# Patient Record
Sex: Female | Born: 1990 | Race: Black or African American | Hispanic: No | Marital: Single | Smoking: Never smoker
Health system: Southern US, Community
[De-identification: ages and names within clinical notes are randomized; demographics above are authoritative.]

## PROBLEM LIST (undated history)

## (undated) DIAGNOSIS — D649 Anemia, unspecified: Secondary | ICD-10-CM

## (undated) DIAGNOSIS — A6000 Herpesviral infection of urogenital system, unspecified: Secondary | ICD-10-CM

## (undated) DIAGNOSIS — T4145XA Adverse effect of unspecified anesthetic, initial encounter: Secondary | ICD-10-CM

## (undated) DIAGNOSIS — A749 Chlamydial infection, unspecified: Secondary | ICD-10-CM

## (undated) DIAGNOSIS — J40 Bronchitis, not specified as acute or chronic: Secondary | ICD-10-CM

## (undated) DIAGNOSIS — R51 Headache: Secondary | ICD-10-CM

## (undated) DIAGNOSIS — T8859XA Other complications of anesthesia, initial encounter: Secondary | ICD-10-CM

## (undated) DIAGNOSIS — R519 Headache, unspecified: Secondary | ICD-10-CM

## (undated) DIAGNOSIS — B999 Unspecified infectious disease: Secondary | ICD-10-CM

## (undated) DIAGNOSIS — N83209 Unspecified ovarian cyst, unspecified side: Secondary | ICD-10-CM

---
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
## 2011-01-20 HISTORY — PX: HERNIA REPAIR: SHX51

## 2011-02-13 ENCOUNTER — Emergency Department (HOSPITAL_COMMUNITY)
Admission: EM | Admit: 2011-02-13 | Discharge: 2011-02-13 | Disposition: A | Discharge status: Home / Self Care | Payer: Self-pay | Attending: Emergency Medicine | Admitting: Emergency Medicine

## 2011-02-13 ENCOUNTER — Encounter (HOSPITAL_COMMUNITY): Payer: Self-pay

## 2011-02-13 DIAGNOSIS — M545 Low back pain, unspecified: Secondary | ICD-10-CM | POA: Insufficient documentation

## 2011-02-13 DIAGNOSIS — N72 Inflammatory disease of cervix uteri: Secondary | ICD-10-CM | POA: Insufficient documentation

## 2011-02-13 DIAGNOSIS — N898 Other specified noninflammatory disorders of vagina: Secondary | ICD-10-CM | POA: Insufficient documentation

## 2011-02-13 DIAGNOSIS — R300 Dysuria: Secondary | ICD-10-CM | POA: Insufficient documentation

## 2011-02-13 HISTORY — DX: Anemia, unspecified: D64.9

## 2011-02-13 LAB — URINE MICROSCOPIC-ADD ON

## 2011-02-13 LAB — URINALYSIS, ROUTINE W REFLEX MICROSCOPIC
Glucose, UA: NEGATIVE mg/dL
Hgb urine dipstick: NEGATIVE
Protein, ur: 30 mg/dL — AB
pH: 7.5 (ref 5.0–8.0)

## 2011-02-13 LAB — WET PREP, GENITAL: Yeast Wet Prep HPF POC: NONE SEEN

## 2011-02-13 LAB — POCT PREGNANCY, URINE: Preg Test, Ur: NEGATIVE

## 2011-02-13 MED ORDER — CEFTRIAXONE SODIUM 250 MG IJ SOLR
250.0000 mg | Freq: Once | INTRAMUSCULAR | Status: AC
  Administered 2011-02-13: 250 mg via INTRAMUSCULAR
  Filled 2011-02-13: qty 250

## 2011-02-13 MED ORDER — AZITHROMYCIN 1 G PO PACK
1.0000 g | PACK | Freq: Once | ORAL | Status: AC
  Administered 2011-02-13: 1 g via ORAL
  Filled 2011-02-13: qty 1

## 2011-02-13 MED ORDER — PHENAZOPYRIDINE HCL 200 MG PO TABS: 200.0000 mg | ORAL_TABLET | Freq: Three times a day (TID) | ORAL | Status: AC

## 2011-02-13 NOTE — ED Notes (Addendum)
Pt presents with vaginal discharge x 2 days with abdominal pain and back pain, burning with urination. Was by a doctor yesterday and was given a prescription for Cipro but patient claimed that her symptoms are worse

## 2011-02-13 NOTE — ED Provider Notes (Signed)
History     CSN: [id number]  Arrival date & time 02/13/11  1408   None     Chief Complaint  Patient presents with  . Vaginal Discharge    (Consider location/radiation/quality/duration/timing/severity/associated sxs/prior treatment) HPI Complains of dysuria with vaginal discharge onset 4 days ago seen at [facility] at [facility] yesterday received fluconazole, prescription for Cipro of which she's taken 3 doses, and metronidazole, without relief. Symptoms worse with urination. No other complaint. Pain is at urethral meatus radiates low back. No nausea no vomiting no other complaint. Pain is worse with urination she is asymptomatic when not urinating aside from vaginal discharge Past Medical History  Diagnosis Date  . Anemia     Past Surgical History  Procedure Date  . Hernia repair     No family history on file.  History  Substance Use Topics  . Smoking status: Not on file  . Smokeless tobacco: Not on file  . Alcohol Use: Yes   Nonsmoker OB History    Grav Para Term Preterm Abortions TAB SAB Ect Mult Living                  Review of Systems  Constitutional: Negative.   HENT: Negative.   Respiratory: Negative.   Cardiovascular: Negative.   Gastrointestinal: Negative.   Genitourinary: Positive for dysuria and vaginal discharge.  Musculoskeletal: Negative.   Skin: Negative.   Neurological: Negative.   Hematological: Negative.   Psychiatric/Behavioral: Negative.     Allergies  Review of patient's allergies indicates no known allergies.  Home Medications   Current Outpatient Rx  Name Route Sig Dispense Refill  . CIPROFLOXACIN HCL 500 MG PO TABS Oral Take 500 mg by mouth 2 (two) times daily. Started on 02/12/11 for a uti    . MEDROXYPROGESTERONE ACETATE 150 MG/ML IM SUSP Intramuscular Inject 150 mg into the muscle every 3 (three) months.    Marland Kitchen METRONIDAZOLE 500 MG PO TABS Oral Take 500 mg by mouth 2 (two) times daily. Started on 02-12-11  for uti.      BP 124/81  Pulse 103  Temp(Src) 98.2 F (36.8 C) (Oral)  Resp 16  SpO2 99%  LMP 01/15/2011  Physical Exam  Constitutional: She appears well-developed and well-nourished.  HENT:  Head: Normocephalic and atraumatic.  Eyes: Conjunctivae are normal. Pupils are equal, round, and reactive to light.  Neck: Neck supple. No tracheal deviation present. No thyromegaly present.  Cardiovascular: Normal rate and regular rhythm.   No murmur heard. Pulmonary/Chest: Effort normal and breath sounds normal.  Abdominal: Soft. Bowel sounds are normal. She exhibits no distension. There is no tenderness.  Genitourinary: Vaginal discharge found.       No external lesion; slight clear vaginal discharge; positive cervical motion tenderness, cervix erythematous, cervical os closed no adnexal masses or tenderness  Musculoskeletal: Normal range of motion. She exhibits no edema and no tenderness.  Neurological: She is alert. Coordination normal.  Skin: Skin is warm and dry. No rash noted.  Psychiatric: She has a normal mood and affect.    ED Course  Procedures (including critical care time)   Labs Reviewed  URINALYSIS, ROUTINE W REFLEX MICROSCOPIC  POCT PREGNANCY, URINE   No results found.   No diagnosis found.  Results for orders placed during the hospital encounter of 02/13/11  URINALYSIS, ROUTINE W REFLEX MICROSCOPIC      Component Value Range   Color, Urine YELLOW  YELLOW    APPearance CLEAR  CLEAR  Specific Gravity, Urine 1.027  1.005 - 1.030    pH 7.5  5.0 - 8.0    Glucose, UA NEGATIVE  NEGATIVE (mg/dL)   Hgb urine dipstick NEGATIVE  NEGATIVE    Bilirubin Urine NEGATIVE  NEGATIVE    Ketones, ur NEGATIVE  NEGATIVE (mg/dL)   Protein, ur 30 (*) NEGATIVE (mg/dL)   Urobilinogen, UA 1.0  0.0 - 1.0 (mg/dL)   Nitrite NEGATIVE  NEGATIVE    Leukocytes, UA SMALL (*) NEGATIVE   WET PREP, GENITAL      Component Value Range   Yeast, Wet Prep NONE SEEN  NONE SEEN    Trich, Wet  Prep NONE SEEN  NONE SEEN    Clue Cells, Wet Prep FEW (*) NONE SEEN    WBC, Wet Prep HPF POC MANY (*) NONE SEEN   POCT PREGNANCY, URINE      Component Value Range   Preg Test, Ur NEGATIVE  NEGATIVE   URINE MICROSCOPIC-ADD ON      Component Value Range   Squamous Epithelial / LPF FEW (*) RARE    WBC, UA 0-2  <3 (WBC/hpf)   RBC / HPF 0-2  <3 (RBC/hpf)   Bacteria, UA RARE  RARE    Urine-Other MUCOUS PRESENT     No results found.   MDM  In light of fact the patient practices unprotected sex with exam consistent with cervicitis; will treat with Rocephin and Zithromax  rx for pyriduim; F/u student health cencter @  [employer] as needed safe sex encouraged Dx #1 cervicitis #2 dysuria       Doug Sou, MD 02/13/11 1610

## 2011-02-14 LAB — GC/CHLAMYDIA PROBE AMP, GENITAL: GC Probe Amp, Genital: NEGATIVE

## 2011-05-12 ENCOUNTER — Encounter (HOSPITAL_COMMUNITY): Payer: Self-pay

## 2011-05-12 ENCOUNTER — Emergency Department (HOSPITAL_COMMUNITY)
Admission: EM | Admit: 2011-05-12 | Discharge: 2011-05-12 | Disposition: A | Discharge status: Home / Self Care | Payer: Self-pay | Attending: Emergency Medicine | Admitting: Emergency Medicine

## 2011-05-12 DIAGNOSIS — R109 Unspecified abdominal pain: Secondary | ICD-10-CM | POA: Insufficient documentation

## 2011-05-12 DIAGNOSIS — A499 Bacterial infection, unspecified: Secondary | ICD-10-CM | POA: Insufficient documentation

## 2011-05-12 DIAGNOSIS — R10819 Abdominal tenderness, unspecified site: Secondary | ICD-10-CM | POA: Insufficient documentation

## 2011-05-12 DIAGNOSIS — N72 Inflammatory disease of cervix uteri: Secondary | ICD-10-CM | POA: Insufficient documentation

## 2011-05-12 DIAGNOSIS — N760 Acute vaginitis: Secondary | ICD-10-CM | POA: Insufficient documentation

## 2011-05-12 DIAGNOSIS — R110 Nausea: Secondary | ICD-10-CM | POA: Insufficient documentation

## 2011-05-12 DIAGNOSIS — B9689 Other specified bacterial agents as the cause of diseases classified elsewhere: Secondary | ICD-10-CM | POA: Insufficient documentation

## 2011-05-12 DIAGNOSIS — N949 Unspecified condition associated with female genital organs and menstrual cycle: Secondary | ICD-10-CM | POA: Insufficient documentation

## 2011-05-12 LAB — URINE MICROSCOPIC-ADD ON

## 2011-05-12 LAB — URINALYSIS, ROUTINE W REFLEX MICROSCOPIC
Nitrite: NEGATIVE
Protein, ur: NEGATIVE mg/dL
Specific Gravity, Urine: 1.018 (ref 1.005–1.030)
Urobilinogen, UA: 1 mg/dL (ref 0.0–1.0)

## 2011-05-12 LAB — POCT PREGNANCY, URINE: Preg Test, Ur: NEGATIVE

## 2011-05-12 MED ORDER — DOXYCYCLINE HYCLATE 100 MG PO CAPS: 100.0000 mg | ORAL_CAPSULE | Freq: Two times a day (BID) | ORAL | Status: AC

## 2011-05-12 MED ORDER — METRONIDAZOLE 500 MG PO TABS: 500.0000 mg | ORAL_TABLET | Freq: Two times a day (BID) | ORAL | Status: AC

## 2011-05-12 MED ORDER — LIDOCAINE HCL 1 % IJ SOLN
INTRAMUSCULAR | Status: AC
  Filled 2011-05-12: qty 20

## 2011-05-12 MED ORDER — CEFTRIAXONE SODIUM 250 MG IJ SOLR
250.0000 mg | Freq: Once | INTRAMUSCULAR | Status: AC
  Administered 2011-05-12: 250 mg via INTRAMUSCULAR
  Filled 2011-05-12: qty 250

## 2011-05-12 NOTE — ED Provider Notes (Signed)
History     CSN: [id number]  Arrival date & time 05/12/11  1417   First MD Initiated Contact with Patient 05/12/11 1545      Chief Complaint  Patient presents with  . Abdominal Pain    (Consider location/radiation/quality/duration/timing/severity/associated sxs/prior treatment) HPI Comments: Patient reports that she has had intermittent lower abdominal cramping for the past 2.5 months.  Patient also reports that she has had yellowish colored vaginal discharge for the past 1.5 weeks.  She reports that she was seen [facility] and was told that nothing was wrong. PMH significant for Chlamydia and Genital Herpes.  She reports that she currently has unprotected sex and is not on any form of birth control.  LMP 01-09-11.  She states that her periods are irregular and that prior to December she was on Depo Provera.  She is G2P0.  She had an elective abortion at 21 years old and a spontaneous abortion last year.  She denies any recent vaginal bleeding or passage of vaginal tissue.  She reports that she has been nauseous for the past 2 weeks.  Denies vomiting.  She is unsure if she has had any recent fevers, but states that she felt warm last week.  Patient has Gynecologist appointment scheduled for 05-21-11.  Patient is a 21 y.o. female presenting with abdominal pain. The history is provided by the patient.  Abdominal Pain The primary symptoms of the illness include abdominal pain, nausea and vaginal discharge. The primary symptoms of the illness do not include fever, vomiting, diarrhea or vaginal bleeding.  The vaginal discharge is not associated with dyspareunia.   Symptoms associated with the illness do not include chills, constipation or hematuria.    Past Medical History  Diagnosis Date  . Anemia     Past Surgical History  Procedure Date  . Hernia repair     No family history on file.  History  Substance Use Topics  . Smoking status: Not on file  .  Smokeless tobacco: Not on file  . Alcohol Use: Yes    OB History    Grav Para Term Preterm Abortions TAB SAB Ect Mult Living                  Review of Systems  Constitutional: Negative for fever and chills.  Gastrointestinal: Positive for nausea and abdominal pain. Negative for vomiting, diarrhea, constipation and abdominal distention.  Genitourinary: Positive for vaginal discharge, menstrual problem and pelvic pain. Negative for hematuria, flank pain, decreased urine volume, vaginal bleeding, difficulty urinating and dyspareunia.  Skin: Negative for rash.  Neurological: Negative for dizziness, syncope and light-headedness.    Allergies  Review of patient's allergies indicates no known allergies.  Home Medications   Current Outpatient Rx  Name Route Sig Dispense Refill  . IBUPROFEN 200 MG PO TABS Oral Take 400 mg by mouth every 12 (twelve) hours.      BP 105/64  Pulse 87  Temp(Src) 97.8 F (36.6 C) (Oral)  Resp 18  SpO2 100%  LMP 01/11/2011  Physical Exam  Nursing note and vitals reviewed. Constitutional: She appears well-developed and well-nourished.  Non-toxic appearance. She does not have a sickly appearance. She does not appear ill. No distress.  HENT:  Head: Normocephalic and atraumatic.  Mouth/Throat: Oropharynx is clear and moist.  Cardiovascular: Normal rate, regular rhythm and normal heart sounds.   Pulmonary/Chest: Effort normal and breath sounds normal.  Abdominal: Soft. Bowel sounds are normal. She exhibits no distension  and no mass. There is tenderness. There is no rebound, no guarding and no CVA tenderness.       Mild suprapubic abdominal tenderness  Genitourinary: Vagina normal and uterus normal. There is no rash or lesion on the right labia. There is no rash or lesion on the left labia. Cervix exhibits motion tenderness and discharge. Cervix exhibits no friability. Right adnexum displays no mass, no tenderness and no fullness. Left adnexum displays no  mass, no tenderness and no fullness.       Moderate Cervical motion tenderness  Neurological: She is alert.  Skin: Skin is warm and dry. She is not diaphoretic.  Psychiatric: She has a normal mood and affect.    ED Course  Procedures (including critical care time)  Labs Reviewed  URINALYSIS, ROUTINE W REFLEX MICROSCOPIC - Abnormal; Notable for the following:    Leukocytes, UA SMALL (*)    All other components within normal limits  URINE MICROSCOPIC-ADD ON - Abnormal; Notable for the following:    Squamous Epithelial / LPF FEW (*)    Bacteria, UA FEW (*)    All other components within normal limits  POCT PREGNANCY, URINE  GC/CHLAMYDIA PROBE AMP, GENITAL  WET PREP, GENITAL   No results found.   No diagnosis found.    MDM  Patient with intermittent lower abdominal cramping for the past 2.5 months and vaginal discharge for the past 1.5 weeks.  Due to patient's history of unprotected sex, CMT, and prior history of STD's.  Patient treated for Gonorrhea and Chlamydia.  Wet prep showing few clue cells and few WBC.  Patient also given Rx for Metronidazole.  Patient has appointment scheduled with Gynecology next week. Patient is afebrile, no vomiting, able to tolerate po liquids, and non ill appearing.  Therefore, feel that patient can be discharged home.        Pascal Lux [place], PA-C 05/12/11 2155

## 2011-05-12 NOTE — ED Notes (Signed)
Pt in from home with c/o abd pain x2 months states cramping and wants to be tested for PID states some nausea/vomiting

## 2011-05-13 LAB — GC/CHLAMYDIA PROBE AMP, GENITAL
Chlamydia, DNA Probe: NEGATIVE
GC Probe Amp, Genital: NEGATIVE

## 2011-05-13 NOTE — ED Provider Notes (Signed)
Medical screening examination/treatment/procedure(s) were performed by non-physician practitioner and as supervising physician I was immediately available for consultation/collaboration.  Linden Mikes, MD 05/13/11 0015 

## 2012-02-26 ENCOUNTER — Emergency Department (HOSPITAL_COMMUNITY)
Admission: EM | Admit: 2012-02-26 | Discharge: 2012-02-26 | Disposition: A | Discharge status: Home / Self Care | Payer: No Typology Code available for payment source | Attending: Emergency Medicine | Admitting: Emergency Medicine

## 2012-02-26 ENCOUNTER — Encounter (HOSPITAL_COMMUNITY): Payer: Self-pay | Admitting: Emergency Medicine

## 2012-02-26 DIAGNOSIS — Z3202 Encounter for pregnancy test, result negative: Secondary | ICD-10-CM | POA: Insufficient documentation

## 2012-02-26 DIAGNOSIS — N898 Other specified noninflammatory disorders of vagina: Secondary | ICD-10-CM | POA: Insufficient documentation

## 2012-02-26 HISTORY — DX: Bronchitis, not specified as acute or chronic: J40

## 2012-02-26 LAB — URINE MICROSCOPIC-ADD ON

## 2012-02-26 LAB — URINALYSIS, ROUTINE W REFLEX MICROSCOPIC
Bilirubin Urine: NEGATIVE
Ketones, ur: NEGATIVE mg/dL
Nitrite: NEGATIVE
Urobilinogen, UA: 1 mg/dL (ref 0.0–1.0)
pH: 6.5 (ref 5.0–8.0)

## 2012-02-26 NOTE — ED Notes (Signed)
Pt states that for the past few weeks she has had vaginal bleeding with "mucus" like in the blood and last normal cycle was oct 11. States that she took herself of birth control 1 yr ago. N/v intermit 1 week ago.

## 2012-02-27 LAB — URINE CULTURE: Colony Count: 15000

## 2012-03-07 ENCOUNTER — Encounter (HOSPITAL_COMMUNITY): Payer: Self-pay | Admitting: Emergency Medicine

## 2012-03-07 ENCOUNTER — Emergency Department (HOSPITAL_COMMUNITY)
Admission: EM | Admit: 2012-03-07 | Discharge: 2012-03-07 | Discharge status: Against Medical Advice | Payer: No Typology Code available for payment source | Attending: Emergency Medicine | Admitting: Emergency Medicine

## 2012-03-07 DIAGNOSIS — N898 Other specified noninflammatory disorders of vagina: Secondary | ICD-10-CM | POA: Insufficient documentation

## 2012-03-07 LAB — URINALYSIS, ROUTINE W REFLEX MICROSCOPIC
Glucose, UA: NEGATIVE mg/dL
Protein, ur: NEGATIVE mg/dL
Specific Gravity, Urine: 1.026 (ref 1.005–1.030)
pH: 7 (ref 5.0–8.0)

## 2012-03-07 LAB — URINE MICROSCOPIC-ADD ON

## 2012-03-07 LAB — POCT PREGNANCY, URINE: Preg Test, Ur: NEGATIVE

## 2012-03-07 NOTE — ED Notes (Signed)
States that she has been off of Depo for 1 year. States that has been having irregular periods since and was here on 2/7 and was unable to wait to be seen. States that she is still seeing the "mucus/tissue" with the bleeding.

## 2012-03-07 NOTE — ED Notes (Signed)
Pt states she does not want to wait any longer to be seen. Pt states she will sign AMA form. Pt ambulatory at d/c with steady gait.

## 2012-03-08 ENCOUNTER — Emergency Department (HOSPITAL_COMMUNITY)
Admission: EM | Admit: 2012-03-08 | Discharge: 2012-03-08 | Disposition: A | Discharge status: Home / Self Care | Payer: BC Managed Care – PPO | Attending: Emergency Medicine | Admitting: Emergency Medicine

## 2012-03-08 ENCOUNTER — Encounter (HOSPITAL_COMMUNITY): Payer: Self-pay | Admitting: Emergency Medicine

## 2012-03-08 DIAGNOSIS — N72 Inflammatory disease of cervix uteri: Secondary | ICD-10-CM

## 2012-03-08 DIAGNOSIS — Z8709 Personal history of other diseases of the respiratory system: Secondary | ICD-10-CM | POA: Insufficient documentation

## 2012-03-08 DIAGNOSIS — N898 Other specified noninflammatory disorders of vagina: Secondary | ICD-10-CM | POA: Insufficient documentation

## 2012-03-08 DIAGNOSIS — Z792 Long term (current) use of antibiotics: Secondary | ICD-10-CM | POA: Insufficient documentation

## 2012-03-08 DIAGNOSIS — D649 Anemia, unspecified: Secondary | ICD-10-CM | POA: Insufficient documentation

## 2012-03-08 DIAGNOSIS — Z3202 Encounter for pregnancy test, result negative: Secondary | ICD-10-CM | POA: Insufficient documentation

## 2012-03-08 DIAGNOSIS — Z79899 Other long term (current) drug therapy: Secondary | ICD-10-CM | POA: Insufficient documentation

## 2012-03-08 DIAGNOSIS — Z8619 Personal history of other infectious and parasitic diseases: Secondary | ICD-10-CM | POA: Insufficient documentation

## 2012-03-08 HISTORY — DX: Herpesviral infection of urogenital system, unspecified: A60.00

## 2012-03-08 LAB — CBC WITH DIFFERENTIAL/PLATELET
Basophils Absolute: 0 10*3/uL (ref 0.0–0.1)
Basophils Relative: 1 % (ref 0–1)
Eosinophils Absolute: 0 10*3/uL (ref 0.0–0.7)
MCH: 26.9 pg (ref 26.0–34.0)
MCHC: 32.5 g/dL (ref 30.0–36.0)
Monocytes Relative: 7 % (ref 3–12)
Neutro Abs: 1.6 10*3/uL — ABNORMAL LOW (ref 1.7–7.7)
Neutrophils Relative %: 41 % — ABNORMAL LOW (ref 43–77)
Platelets: 186 10*3/uL (ref 150–400)
RDW: 12.4 % (ref 11.5–15.5)

## 2012-03-08 LAB — URINALYSIS, ROUTINE W REFLEX MICROSCOPIC
Bilirubin Urine: NEGATIVE
Ketones, ur: NEGATIVE mg/dL
Leukocytes, UA: NEGATIVE
Nitrite: NEGATIVE
Specific Gravity, Urine: 1.026 (ref 1.005–1.030)
Urobilinogen, UA: 0.2 mg/dL (ref 0.0–1.0)
pH: 6.5 (ref 5.0–8.0)

## 2012-03-08 LAB — BASIC METABOLIC PANEL
BUN: 16 mg/dL (ref 6–23)
Chloride: 102 mEq/L (ref 96–112)
GFR calc Af Amer: >90 mL/min (ref >90)
GFR calc non Af Amer: >90 mL/min (ref >90)
Potassium: 3.8 mEq/L (ref 3.5–5.1)

## 2012-03-08 LAB — WET PREP, GENITAL

## 2012-03-08 MED ORDER — LIDOCAINE HCL (PF) 1 % IJ SOLN
INTRAMUSCULAR | Status: AC
  Administered 2012-03-08: 2 mL
  Filled 2012-03-08: qty 5

## 2012-03-08 MED ORDER — METRONIDAZOLE 500 MG PO TABS: 500.0000 mg | ORAL_TABLET | Freq: Two times a day (BID) | ORAL | Status: DC

## 2012-03-08 MED ORDER — CEFTRIAXONE SODIUM 250 MG IJ SOLR
250.0000 mg | Freq: Once | INTRAMUSCULAR | Status: AC
  Administered 2012-03-08: 250 mg via INTRAMUSCULAR
  Filled 2012-03-08: qty 250

## 2012-03-08 MED ORDER — AZITHROMYCIN 250 MG PO TABS
1000.0000 mg | ORAL_TABLET | Freq: Once | ORAL | Status: AC
  Administered 2012-03-08: 1000 mg via ORAL
  Filled 2012-03-08: qty 4

## 2012-03-08 NOTE — ED Provider Notes (Signed)
History     CSN: [id number]  Arrival date & time 03/08/12  5621   First MD Initiated Contact with Patient 03/08/12 1126      Chief Complaint  Patient presents with  . Vaginal Discharge    (Consider location/radiation/quality/duration/timing/severity/associated sxs/prior treatment) Patient is a 22 y.o. female presenting with vaginal discharge. The history is provided by the patient (the pt complains of vaginal bleeding). No language interpreter was used.  Vaginal Discharge This is a recurrent problem. The current episode started more than 2 days ago. The problem occurs daily. The problem has not changed since onset.Pertinent negatives include no chest pain, no abdominal pain and no headaches. Nothing aggravates the symptoms. Nothing relieves the symptoms.    Past Medical History  Diagnosis Date  . Anemia   . Bronchitis   . Herpes genitalia     Past Surgical History  Procedure Laterality Date  . Hernia repair    . Induced abortion      No family history on file.  History  Substance Use Topics  . Smoking status: Not on file  . Smokeless tobacco: Not on file  . Alcohol Use: Yes    OB History   Grav Para Term Preterm Abortions TAB SAB Ect Mult Living                  Review of Systems  Constitutional: Negative for fatigue.  HENT: Negative for congestion, sinus pressure and ear discharge.   Eyes: Negative for discharge.  Respiratory: Negative for cough.   Cardiovascular: Negative for chest pain.  Gastrointestinal: Negative for abdominal pain and diarrhea.  Genitourinary: Positive for vaginal bleeding and vaginal discharge. Negative for frequency and hematuria.  Musculoskeletal: Negative for back pain.  Skin: Negative for rash.  Neurological: Negative for seizures and headaches.  Psychiatric/Behavioral: Negative for hallucinations.    Allergies  Review of patient's allergies indicates no known allergies.  Home Medications   Current Outpatient Rx  Name   Route  Sig  Dispense  Refill  . ferrous sulfate 325 (65 FE) MG tablet   Oral   Take 325 mg by mouth daily with breakfast.         . ibuprofen (ADVIL,MOTRIN) 200 MG tablet   Oral   Take 400 mg by mouth 4 (four) times daily as needed for headache.          . valACYclovir (VALTREX) 500 MG tablet   Oral   Take 500 mg by mouth 2 (two) times daily.         . metroNIDAZOLE (FLAGYL) 500 MG tablet   Oral   Take 1 tablet (500 mg total) by mouth 2 (two) times daily. One po bid x 7 days   14 tablet   0     BP 122/70  Pulse 70  Temp(Src) 98.3 F (36.8 C) (Oral)  Resp 20  SpO2 100%  LMP 02/21/2012  Physical Exam  Constitutional: She is oriented to person, place, and time. She appears well-developed.  HENT:  Head: Normocephalic and atraumatic.  Eyes: Conjunctivae and EOM are normal. No scleral icterus.  Neck: Neck supple. No thyromegaly present.  Cardiovascular: Normal rate and regular rhythm.  Exam reveals no gallop and no friction rub.   No murmur heard. Pulmonary/Chest: No stridor. She has no wheezes. She has no rales. She exhibits no tenderness.  Abdominal: She exhibits no distension. There is no tenderness. There is no rebound.  Genitourinary:  Normal exam except friable cervix  Musculoskeletal: Normal range  of motion. She exhibits no edema.  Lymphadenopathy:    She has no cervical adenopathy.  Neurological: She is oriented to person, place, and time. Coordination normal.  Skin: No rash noted. No erythema.  Psychiatric: She has a normal mood and affect. Her behavior is normal.    ED Course  Procedures (including critical care time)  Labs Reviewed  WET PREP, GENITAL - Abnormal; Notable for the following:    Clue Cells Wet Prep HPF POC FEW (*)    WBC, Wet Prep HPF POC MANY (*)    All other components within normal limits  CBC WITH DIFFERENTIAL - Abnormal; Notable for the following:    WBC 3.9 (*)    RBC 5.46 (*)    Neutrophils Relative 41 (*)    Neutro Abs 1.6  (*)    Lymphocytes Relative 51 (*)    All other components within normal limits  GC/CHLAMYDIA PROBE AMP  BASIC METABOLIC PANEL  URINALYSIS, ROUTINE W REFLEX MICROSCOPIC  POCT PREGNANCY, URINE   No results found.   No diagnosis found.    MDM          Benny Lennert, MD 03/08/12 1320

## 2012-03-08 NOTE — ED Notes (Signed)
Onset October 2013 vaginal bleeding bright red Seen OBGyn however having continuous bleeding intermittent.  Went to [facility] long Feb 7 and 17 left before being seen. States dysuria pressure 3/10.

## 2012-03-09 LAB — GC/CHLAMYDIA PROBE AMP
CT Probe RNA: NEGATIVE
GC Probe RNA: NEGATIVE

## 2013-01-28 ENCOUNTER — Encounter (HOSPITAL_COMMUNITY): Payer: Self-pay | Admitting: Emergency Medicine

## 2013-01-28 ENCOUNTER — Emergency Department (INDEPENDENT_AMBULATORY_CARE_PROVIDER_SITE_OTHER)
Admission: EM | Admit: 2013-01-28 | Discharge: 2013-01-28 | Disposition: A | Discharge status: Home / Self Care | Payer: BC Managed Care – PPO | Source: Home / Self Care | Attending: Family Medicine | Admitting: Family Medicine

## 2013-01-28 ENCOUNTER — Other Ambulatory Visit (HOSPITAL_COMMUNITY)
Admission: RE | Admit: 2013-01-28 | Discharge: 2013-01-28 | Disposition: A | Discharge status: Home / Self Care | Payer: BC Managed Care – PPO | Source: Ambulatory Visit | Attending: Family Medicine | Admitting: Family Medicine

## 2013-01-28 DIAGNOSIS — Z113 Encounter for screening for infections with a predominantly sexual mode of transmission: Secondary | ICD-10-CM | POA: Insufficient documentation

## 2013-01-28 DIAGNOSIS — N760 Acute vaginitis: Secondary | ICD-10-CM | POA: Insufficient documentation

## 2013-01-28 DIAGNOSIS — N72 Inflammatory disease of cervix uteri: Secondary | ICD-10-CM

## 2013-01-28 LAB — POCT URINALYSIS DIP (DEVICE)
Bilirubin Urine: NEGATIVE
GLUCOSE, UA: NEGATIVE mg/dL
Ketones, ur: NEGATIVE mg/dL
NITRITE: NEGATIVE
PH: 7.5 (ref 5.0–8.0)
PROTEIN: 30 mg/dL — AB
Specific Gravity, Urine: 1.02 (ref 1.005–1.030)
UROBILINOGEN UA: 0.2 mg/dL (ref 0.0–1.0)

## 2013-01-28 LAB — POCT PREGNANCY, URINE: Preg Test, Ur: NEGATIVE

## 2013-01-28 MED ORDER — AZITHROMYCIN 250 MG PO TABS
ORAL_TABLET | ORAL | Status: AC
  Filled 2013-01-28: qty 4

## 2013-01-28 MED ORDER — CEFTRIAXONE SODIUM 250 MG IJ SOLR
250.0000 mg | Freq: Once | INTRAMUSCULAR | Status: AC
  Administered 2013-01-28: 250 mg via INTRAMUSCULAR

## 2013-01-28 MED ORDER — METRONIDAZOLE 500 MG PO TABS: 500.0000 mg | ORAL_TABLET | Freq: Two times a day (BID) | ORAL | Status: DC

## 2013-01-28 MED ORDER — AZITHROMYCIN 250 MG PO TABS
1000.0000 mg | ORAL_TABLET | Freq: Once | ORAL | Status: AC
Start: 2013-01-28 — End: 2013-01-28
  Administered 2013-01-28: 1000 mg via ORAL

## 2013-01-28 MED ORDER — LIDOCAINE HCL (PF) 1 % IJ SOLN
INTRAMUSCULAR | Status: AC
  Filled 2013-01-28: qty 5

## 2013-01-28 MED ORDER — CEFTRIAXONE SODIUM 250 MG IJ SOLR
INTRAMUSCULAR | Status: AC
  Filled 2013-01-28: qty 250

## 2013-01-28 NOTE — ED Notes (Signed)
Pt c/o vag d/c onset 2 weeks... Reports it started out w/spotting on/off and having abd pain Also c/o freq urination... Denies: dysuria, f/v/n/d... LMP = 11/23/12 Alert w/no signs of acute distress.

## 2013-01-28 NOTE — ED Provider Notes (Signed)
Debbie Frederick is a 23 y.o. female who presents to Urgent Care today for vaginal discharge and mild abdominal pain present for one to 2 weeks. Patient denies any STD exposure. She denies any fevers chills nausea vomiting or diarrhea. She denies any dysuria. She feels well otherwise. No medications tried.   Past Medical History  Diagnosis Date  . Anemia   . Bronchitis   . Herpes genitalia    History  Substance Use Topics  . Smoking status: Never Smoker   . Smokeless tobacco: Not on file  . Alcohol Use: Yes   ROS as above Medications reviewed. No current facility-administered medications for this encounter.   Current Outpatient Prescriptions  Medication Sig Dispense Refill  . Norethin Ace-Eth Estrad-FE (MINASTRIN 24 FE PO) Take by mouth.      . ferrous sulfate 325 (65 FE) MG tablet Take 325 mg by mouth daily with breakfast.      . ibuprofen (ADVIL,MOTRIN) 200 MG tablet Take 400 mg by mouth 4 (four) times daily as needed for headache.       . metroNIDAZOLE (FLAGYL) 500 MG tablet Take 1 tablet (500 mg total) by mouth 2 (two) times daily.  14 tablet  0  . valACYclovir (VALTREX) 500 MG tablet Take 500 mg by mouth 2 (two) times daily.        Exam:  BP 125/87  Pulse 78  Temp(Src) 97.4 F (36.3 C) (Oral)  Resp 14  SpO2 100%  LMP 11/23/2012 Gen: Well NAD HEENT:  MMM Lungs: Normal work of breathing. CTABL Heart: RRR no MRG Abd: NABS, Soft. NT, ND Exts: Non edematous BL  LE, warm and well perfused. GYN: Normal external genitalia. Vaginal canal is a white discharge. Cervix with white pus emerging. Cervix is slightly friable and mildly tender. No cervical motion tenderness or adnexal mass or tenderness bilaterally. Uterus is normal size and position.   Results for orders placed during the hospital encounter of 01/28/13 (from the past 24 hour(s))  POCT URINALYSIS DIP (DEVICE)     Status: Abnormal   Collection Time    01/28/13  4:38 PM      Result Value Range   Glucose, UA NEGATIVE   NEGATIVE mg/dL   Bilirubin Urine NEGATIVE  NEGATIVE   Ketones, ur NEGATIVE  NEGATIVE mg/dL   Specific Gravity, Urine 1.020  1.005 - 1.030   Hgb urine dipstick SMALL (*) NEGATIVE   pH 7.5  5.0 - 8.0   Protein, ur 30 (*) NEGATIVE mg/dL   Urobilinogen, UA 0.2  0.0 - 1.0 mg/dL   Nitrite NEGATIVE  NEGATIVE   Leukocytes, UA SMALL (*) NEGATIVE  POCT PREGNANCY, URINE     Status: None   Collection Time    01/28/13  4:39 PM      Result Value Range   Preg Test, Ur NEGATIVE  NEGATIVE   No results found.  Assessment and Plan: 23 y.o. female with cervicitis. Cytology pending. Plan to treat with 50 mg of IM ceftriaxone, and 1 g of oral azithromycin. Additionally use metronidazole orally for one week. Will call patient with results.  Discussed warning signs or symptoms. Please see discharge instructions. Patient expresses understanding.    Rodolph BongEvan S Gesselle Fitzsimons, MD 01/28/13 [phone]

## 2013-01-28 NOTE — Discharge Instructions (Signed)
Thank you for coming in today. Take Flagyl twice daily for one week. Do not drink with taking this medication. Followup if not getting better. We will call you about your lab results they are significantly abnormal. Recommend getting a HIV and syphilis test at the health department in the near future. If your belly pain worsens, or you have high fever, bad vomiting, blood in your stool or black tarry stool go to the Emergency Room.   Cervicitis Cervicitis is a soreness and swelling (inflammation) of the cervix. Your cervix is located at the bottom of your uterus. It opens up to the vagina. CAUSES   Sexually transmitted infections (STIs).   Allergic reaction.   Medicines or birth control devices that are put in the vagina.   Injury to the cervix.   Bacterial infections.  RISK FACTORS You are at greater risk if you:  Have unprotected sexual intercourse.  Have sexual intercourse with many partners.  Began sexual intercourse at an early age.  Have a history of STIs. SYMPTOMS  There may be no symptoms. If symptoms occur, they may include:   Grey, white, yellow, or bad-smelling vaginal discharge.   Pain or itching of the area outside the vagina.   Painful sexual intercourse.   Lower abdominal or lower back pain, especially during intercourse.   Frequent urination.   Abnormal vaginal bleeding between periods, after sexual intercourse, or after menopause.   Pressure or a heavy feeling in the pelvis.  DIAGNOSIS  Diagnosis is made after a pelvic exam. Other tests may include:   Examination of any discharge under a microscope (wet prep).   A Pap test.  TREATMENT  Treatment will depend on the cause of cervicitis. If it is caused by an STI, both you and your partner will need to be treated. Antibiotic medicines will be given.  HOME CARE INSTRUCTIONS   Do not have sexual intercourse until your health care provider says it is okay.   Do not have sexual  intercourse until your partner has been treated, if your cervicitis is caused by an STI.   Take your antibiotics as directed. Finish them even if you start to feel better.  SEEK MEDICAL CARE IF:  Your symptoms come back.   You have a fever.  MAKE SURE YOU:   Understand these instructions.  Will watch your condition.  Will get help right away if you are not doing well or get worse. Document Released: 01/05/2005 Document Revised: 09/07/2012 Document Reviewed: 06/29/2012 [facility] Patient Information 2014 [employer].

## 2013-01-29 LAB — URINE CULTURE
Colony Count: 3000
SPECIAL REQUESTS: NORMAL

## 2013-02-01 NOTE — ED Notes (Signed)
GC/Chlamydia neg., Affirm: Candida and Trich neg. Gardnerella pos., Urine culture: Insignificant growth. Pt. adequately treated with Flagyl. Vassie MoselleYork, Siedah Sedor M 02/01/2013

## 2013-04-17 ENCOUNTER — Emergency Department (INDEPENDENT_AMBULATORY_CARE_PROVIDER_SITE_OTHER)
Admission: EM | Admit: 2013-04-17 | Discharge: 2013-04-17 | Disposition: A | Discharge status: Home / Self Care | Payer: BC Managed Care – PPO | Source: Home / Self Care | Attending: Emergency Medicine | Admitting: Emergency Medicine

## 2013-04-17 ENCOUNTER — Encounter (HOSPITAL_COMMUNITY): Payer: Self-pay | Admitting: Emergency Medicine

## 2013-04-17 DIAGNOSIS — T148XXA Other injury of unspecified body region, initial encounter: Secondary | ICD-10-CM

## 2013-04-17 DIAGNOSIS — R519 Headache, unspecified: Secondary | ICD-10-CM

## 2013-04-17 DIAGNOSIS — R21 Rash and other nonspecific skin eruption: Secondary | ICD-10-CM

## 2013-04-17 DIAGNOSIS — B354 Tinea corporis: Secondary | ICD-10-CM

## 2013-04-17 DIAGNOSIS — R1110 Vomiting, unspecified: Secondary | ICD-10-CM

## 2013-04-17 DIAGNOSIS — R109 Unspecified abdominal pain: Secondary | ICD-10-CM

## 2013-04-17 DIAGNOSIS — R51 Headache: Secondary | ICD-10-CM

## 2013-04-17 LAB — POCT URINALYSIS DIP (DEVICE)
Bilirubin Urine: NEGATIVE
Glucose, UA: NEGATIVE mg/dL
Hgb urine dipstick: NEGATIVE
KETONES UR: NEGATIVE mg/dL
LEUKOCYTES UA: NEGATIVE
Nitrite: NEGATIVE
Protein, ur: NEGATIVE mg/dL
SPECIFIC GRAVITY, URINE: 1.02 (ref 1.005–1.030)
Urobilinogen, UA: 0.2 mg/dL (ref 0.0–1.0)
pH: 8.5 — ABNORMAL HIGH (ref 5.0–8.0)

## 2013-04-17 LAB — POCT PREGNANCY, URINE: PREG TEST UR: NEGATIVE

## 2013-04-17 MED ORDER — ONDANSETRON 8 MG PO TBDP: 8.0000 mg | ORAL_TABLET | Freq: Three times a day (TID) | ORAL | Status: DC | PRN

## 2013-04-17 MED ORDER — CLOTRIMAZOLE-BETAMETHASONE 1-0.05 % EX CREA: TOPICAL_CREAM | CUTANEOUS | Status: DC

## 2013-04-17 NOTE — ED Provider Notes (Signed)
Medical screening examination/treatment/procedure(s) were performed by non-physician practitioner and as supervising physician I was immediately available for consultation/collaboration.  Leslee Homeavid Natelie Ostrosky, M.D.  Reuben Likesavid C Caren Garske, MD 04/17/13 2215

## 2013-04-17 NOTE — ED Notes (Signed)
Pt c/o HA, n/v, abd pain, and feeling tired onset 3 weeks Also c/o abn bruising onset 1 week and dry skin/patches Taking ibup w/no relief Alert w/no signs of acute distress.

## 2013-04-17 NOTE — ED Provider Notes (Signed)
CSN: [id number]     Arrival date & time 04/17/13  1337 History   None    Chief Complaint  Patient presents with  . Abdominal Pain  . Fatigue   (Consider location/radiation/quality/duration/timing/severity/associated sxs/prior Treatment) HPI Comments: 23 year old female presents with a variety of complaints. These include headache on and off for 3 weeks, fatigue, left thigh bruise for no known reason, rash on right shoulder, and finally abdominal pain and vomiting since yesterday. She has a long history of headaches, she is shortly takes ibuprofen as needed for them. She stopped taking ibuprofen 3 weeks ago and the headaches continue to happen. She has a headache every day, usually starting around noon. For 3 weeks, she has had a bruise on her left, she denies any injury. She states it is not getting any better. No other bruising. No history of any blood disorders. For about 3 weeks she also has a rash on the outside of her right shoulder that is dry, itchy, and sometimes bleeds when she scratches it. She has a new patch more recently that has shown up on her back as well. Finally she started having abdominal pain and vomiting yesterday. She says the pain is not that bad. She has a long history of abdominal pain after getting a mesh placed to repair an umbilical hernia and she also has a history of ovarian cysts. She vomited 3 times today so far but this is not abnormal for her, she occasionally has episodes of vomiting. No vaginal bleeding or discharge. She has some urinary frequency but no dysuria or hematuria. She is sexually active.  Patient is a 23 y.o. female presenting with abdominal pain.  Abdominal Pain Associated symptoms: fatigue and vomiting   Associated symptoms: no chest pain, no chills, no constipation, no cough, no diarrhea, no dysuria, no fever, no nausea and no shortness of breath     Past Medical History  Diagnosis Date  . Anemia   . Bronchitis   . Herpes genitalia    Past  Surgical History  Procedure Laterality Date  . Hernia repair    . Induced abortion     No family history on file. History  Substance Use Topics  . Smoking status: Never Smoker   . Smokeless tobacco: Not on file  . Alcohol Use: Yes   OB History   Grav Para Term Preterm Abortions TAB SAB Ect Mult Living                 Review of Systems  Constitutional: Positive for fatigue. Negative for fever and chills.  Eyes: Negative for visual disturbance.  Respiratory: Negative for cough and shortness of breath.   Cardiovascular: Negative for chest pain, palpitations and leg swelling.  Gastrointestinal: Positive for vomiting and abdominal pain. Negative for nausea, diarrhea, constipation and blood in stool.  Endocrine: Negative for polydipsia and polyuria.  Genitourinary: Negative for dysuria, urgency and frequency.  Musculoskeletal: Negative for arthralgias and myalgias.  Skin: Positive for rash.  Neurological: Positive for headaches. Negative for dizziness, weakness and light-headedness.  Hematological: Bruises/bleeds easily.    Allergies  Review of patient's allergies indicates no known allergies.  Home Medications   Current Outpatient Rx  Name  Route  Sig  Dispense  Refill  . clotrimazole-betamethasone (LOTRISONE) cream      Apply to affected area 2 times daily prn   15 g   0   . ferrous sulfate 325 (65 FE) MG tablet   Oral   Take 325 mg  by mouth daily with breakfast.         . ibuprofen (ADVIL,MOTRIN) 200 MG tablet   Oral   Take 400 mg by mouth 4 (four) times daily as needed for headache.          . metroNIDAZOLE (FLAGYL) 500 MG tablet   Oral   Take 1 tablet (500 mg total) by mouth 2 (two) times daily.   14 tablet   0   . Norethin Ace-Eth Estrad-FE (MINASTRIN 24 FE PO)   Oral   Take by mouth.         . ondansetron (ZOFRAN ODT) 8 MG disintegrating tablet   Oral   Take 1 tablet (8 mg total) by mouth every 8 (eight) hours as needed for nausea or vomiting.    10 tablet   0   . valACYclovir (VALTREX) 500 MG tablet   Oral   Take 500 mg by mouth 2 (two) times daily.          BP 110/69  Pulse 66  Temp(Src) 98.4 F (36.9 C) (Oral)  Resp 18  SpO2 99%  LMP 03/27/2013 Physical Exam  Nursing note and vitals reviewed. Constitutional: She is oriented to person, place, and time. Vital signs are normal. She appears well-developed and well-nourished. No distress.  HENT:  Head: Normocephalic and atraumatic.  Neck: Normal range of motion. Neck supple. No JVD present.  Cardiovascular: Normal rate, regular rhythm and normal heart sounds.  Exam reveals no gallop and no friction rub.   No murmur heard. Pulmonary/Chest: Effort normal and breath sounds normal. No respiratory distress. She has no wheezes. She has no rales.  Abdominal: Normal appearance and bowel sounds are normal. There is no hepatosplenomegaly. There is tenderness (mild) in the suprapubic area and left lower quadrant. There is no rigidity, no rebound, no guarding, no CVA tenderness, no tenderness at McBurney's point and negative Murphy's sign. A hernia (scar from umbilical hernia repair) is present.  Neurological: She is alert and oriented to person, place, and time. She has normal strength. Coordination normal.  Skin: Skin is warm and dry. Ecchymosis (lateral left thigh, slight bruise, she states now it is almost completely gone ) and rash (circular rash on right deltoid with raised, rolled edge with central clearing.  ) noted. She is not diaphoretic.  Psychiatric: She has a normal mood and affect. Judgment normal.    ED Course  Procedures (including critical care time) Labs Review Labs Reviewed  POCT URINALYSIS DIP (DEVICE) - Abnormal; Notable for the following:    pH 8.5 (*)    All other components within normal limits  POCT PREGNANCY, URINE   Imaging Review No results found.   MDM   1. Vomiting   2. Abdominal pain   3. Rash   4. Bruise   5. Headache   6. Tinea corporis     Vitals are normal and stable.  PE is normal with exception of some mild tenderness of the abdomen which she states is chronic.  The non-healing bruise she came in for is healing, she admits she hasnt looked at it in a few days.  Wants to send blood to check for anemia of any clotting problems but she says she doesn't want to do that today.  Treat tinea with lotrisone, nausea with zofran.  ED if abdominal pain worsens, she starts to get sick, or nausea not controlled with zofran   Meds ordered this encounter  Medications  . clotrimazole-betamethasone (LOTRISONE) cream  Sig: Apply to affected area 2 times daily prn    Dispense:  15 g    Refill:  0    Order Specific Question:  Supervising Provider    Answer:  Lorenz CoasterKELLER, DAVID C [id number]]  . ondansetron (ZOFRAN ODT) 8 MG disintegrating tablet    Sig: Take 1 tablet (8 mg total) by mouth every 8 (eight) hours as needed for nausea or vomiting.    Dispense:  10 tablet    Refill:  0    Order Specific Question:  Supervising Provider    Answer:  Lorenz CoasterKELLER, DAVID C [[id number]]     Graylon GoodZachary H Mayreli Alden, PA-C 04/17/13 2144  Graylon GoodZachary H Skyeler Scalese, PA-C 04/17/13 2145

## 2013-04-17 NOTE — Discharge Instructions (Signed)
[place]: [place]  [phone]  [place] [facility]: [place]   [phone]  [facility]: [address] [place]  [phone]  [place] [facility] : [address] [place] [phone]  [facility]: [address] [place] [phone]  [facility]: [place] [phone]  Dr. Blanchie Serve [place]  [phone]  Dr. Benito Mccreedy, [facility]. [place], [place] [address]  [phone]    Go to www.goodrx.com to look up your medications. This will give you a list of where you can find your prescriptions at the most affordable prices.   If you have no primary doctor, here are some resources that may be helpful:  [place] Providers: - [facility]- [address]  [phone];   - [place], [place] [phone]  - [place], [address] [phone] - [place]  [phone]  - Lucianne Lei- [address], [address] [phone]  Only accepts [place] patients       after they have her name applied to their card  -Dr. Benito Mccreedy, [facility]. [place]    [place], [place] [address]  [phone]  Self Pay (no insurance) in [place]: - Sickle Cell Patients: Dr Kevan Ny, [facility] [place]  - Physician Referral Service- [facility]- [address], [place], [phone];  Monday to Friday, 9 a.m. - 7 p.m.; Saturday 9 a.m. to 1 p.m.  [facility]- [address] [place], [place]      [facility]- [address] [phone]  -[facility], [place]; [phone]; or [address], [place]; [phone].   [employer] [facility] [place], [place] [address] [place]; [id number]; Monday to Wednesday, 8:30 a.m. - 5 p.m.; Thursday, 8:30 a.m. - 8 p.m.  [facility], [place], [place]; [id number]; Monday to Friday, 8 a.m. - 4:30 p.m.   [facility], [place] [address], [place], [place]; first and third Saturday of the month, 9:30 a.m. - 12:30 p.m.  [facility], [address], [place], [place]; second Saturday of the month, 9 a.m. -noon.  [place] for children. For information, call [phone]; [phone]; or [phone].  Other agencies that provide inexpensive medical care:     [facility]  [facility]  [phone]    [facility]  [phone] [place]    [facility]  [phone]    [facility]  [phone], [phone]; or [phone].  Chronic Pain Problems Contact [facility]  [phone] Patients need to be referred by their primary care doctor.  [facility] [place]  [facility]. [address] [place]                       [address]      [address] [place] [address]   [phone] (After Hours)  General Information: Finding a doctor when you do not have health insurance can be tricky. Although you are not limited by an insurance plan, you are of course limited by her finances  and how much but he can pay out of pocket.  What are your options if you don't have health insurance?   1) Find a Librarian, academic and Pay Out of Pocket Although you won't have to find out who is covered by your insurance plan, it is a good idea to ask around and get recommendations. You will then need to call the office and see if the doctor you have chosen will accept you as a new patient and what types of options they offer for patients who are self-pay. Some doctors offer discounts or will set up payment plans for their patients who do not have insurance, but you will need to ask so you aren't surprised when you get to your appointment.  2) Contact Your Local Health Department Not all health departments have doctors that can see patients for sick visits, but many do, so it is worth a call to see if yours does. If you don't know where your local health department is, you can check in your phone book. The CDC also has a tool to help you locate your state's health department, and many state websites also have listings of all of their local health departments.  3) Find a Walk-in Clinic If your illness is not likely to be very severe or complicated, you may want to try a walk in clinic. These are popping up all over the country in pharmacies, drugstores, and shopping centers. They're usually staffed by nurse practitioners or physician assistants that have been trained to treat common illnesses and complaints. They're usually fairly quick and inexpensive. However, if you have serious medical issues or chronic medical problems, these are probably not your best option   Abdominal Pain, Women Abdominal (stomach, pelvic, or belly) pain can be caused by many things. It is important to tell your doctor:  The location of the pain.  Does it come and go or is it present all the time?  Are there things that start the pain (eating certain foods, exercise)?  Are there other symptoms associated with the pain (fever,  nausea, vomiting, diarrhea)? All of this is helpful to know when trying to find the cause of the pain. CAUSES   Stomach: virus or bacteria infection, or ulcer.  Intestine: appendicitis (inflamed appendix), regional ileitis (Crohn's disease), ulcerative colitis (inflamed colon), irritable bowel syndrome, diverticulitis (inflamed diverticulum of the colon), or cancer of the stomach or intestine.  Gallbladder disease or stones in the gallbladder.  Kidney disease, kidney stones, or infection.  Pancreas infection or cancer.  Fibromyalgia (pain disorder).  Diseases of the female organs:  Uterus: fibroid (non-cancerous) tumors or infection.  Fallopian tubes: infection or tubal pregnancy.  Ovary: cysts or tumors.  Pelvic adhesions (scar tissue).  Endometriosis (uterus lining tissue growing in the pelvis and on the pelvic organs).  Pelvic congestion syndrome (female organs filling up  with blood just before the menstrual period).  Pain with the menstrual period.  Pain with ovulation (producing an egg).  Pain with an IUD (intrauterine device, birth control) in the uterus.  Cancer of the female organs.  Functional pain (pain not caused by a disease, may improve without treatment).  Psychological pain.  Depression. DIAGNOSIS  Your doctor will decide the seriousness of your pain by doing an examination.  Blood tests.  X-rays.  Ultrasound.  CT scan (computed tomography, special type of X-ray).  MRI (magnetic resonance imaging).  Cultures, for infection.  Barium enema (dye inserted in the large intestine, to better view it with X-rays).  Colonoscopy (looking in intestine with a lighted tube).  Laparoscopy (minor surgery, looking in abdomen with a lighted tube).  Major abdominal exploratory surgery (looking in abdomen with a large incision). TREATMENT  The treatment will depend on the cause of the pain.   Many cases can be observed and treated at  home.  Over-the-counter medicines recommended by your caregiver.  Prescription medicine.  Antibiotics, for infection.  Birth control pills, for painful periods or for ovulation pain.  Hormone treatment, for endometriosis.  Nerve blocking injections.  Physical therapy.  Antidepressants.  Counseling with a psychologist or psychiatrist.  Minor or major surgery. HOME CARE INSTRUCTIONS   Do not take laxatives, unless directed by your caregiver.  Take over-the-counter pain medicine only if ordered by your caregiver. Do not take aspirin because it can cause an upset stomach or bleeding.  Try a clear liquid diet (broth or water) as ordered by your caregiver. Slowly move to a bland diet, as tolerated, if the pain is related to the stomach or intestine.  Have a thermometer and take your temperature several times a day, and record it.  Bed rest and sleep, if it helps the pain.  Avoid sexual intercourse, if it causes pain.  Avoid stressful situations.  Keep your follow-up appointments and tests, as your caregiver orders.  If the pain does not go away with medicine or surgery, you may try:  Acupuncture.  Relaxation exercises (yoga, meditation).  Group therapy.  Counseling. SEEK MEDICAL CARE IF:   You notice certain foods cause stomach pain.  Your home care treatment is not helping your pain.  You need stronger pain medicine.  You want your IUD removed.  You feel faint or lightheaded.  You develop nausea and vomiting.  You develop a rash.  You are having side effects or an allergy to your medicine. SEEK IMMEDIATE MEDICAL CARE IF:   Your pain does not go away or gets worse.  You have a fever.  Your pain is felt only in portions of the abdomen. The right side could possibly be appendicitis. The left lower portion of the abdomen could be colitis or diverticulitis.  You are passing blood in your stools (bright red or black tarry stools, with or without  vomiting).  You have blood in your urine.  You develop chills, with or without a fever.  You pass out. MAKE SURE YOU:   Understand these instructions.  Will watch your condition.  Will get help right away if you are not doing well or get worse. Document Released: 11/02/2006 Document Revised: 03/30/2011 Document Reviewed: 11/22/2008 [facility] Patient Information 2014 [place], [place].

## 2013-04-25 ENCOUNTER — Emergency Department (HOSPITAL_COMMUNITY)
Admission: EM | Admit: 2013-04-25 | Discharge: 2013-04-25 | Disposition: A | Discharge status: Home / Self Care | Payer: BC Managed Care – PPO | Attending: Emergency Medicine | Admitting: Emergency Medicine

## 2013-04-25 ENCOUNTER — Encounter (HOSPITAL_COMMUNITY): Payer: Self-pay | Admitting: Emergency Medicine

## 2013-04-25 DIAGNOSIS — A6000 Herpesviral infection of urogenital system, unspecified: Secondary | ICD-10-CM | POA: Insufficient documentation

## 2013-04-25 DIAGNOSIS — D649 Anemia, unspecified: Secondary | ICD-10-CM | POA: Insufficient documentation

## 2013-04-25 DIAGNOSIS — H00019 Hordeolum externum unspecified eye, unspecified eyelid: Secondary | ICD-10-CM | POA: Insufficient documentation

## 2013-04-25 DIAGNOSIS — J40 Bronchitis, not specified as acute or chronic: Secondary | ICD-10-CM | POA: Insufficient documentation

## 2013-04-25 MED ORDER — TOBRAMYCIN 0.3 % OP OINT
TOPICAL_OINTMENT | Freq: Two times a day (BID) | OPHTHALMIC | Status: DC
  Administered 2013-04-25: 13:00:00 via OPHTHALMIC
  Filled 2013-04-25: qty 3.5

## 2013-04-25 NOTE — ED Notes (Signed)
Started 2 days ago with right lower lid pain and swelling. States has gotten progressively worse. Has been applying warm compresses.

## 2013-04-25 NOTE — ED Notes (Signed)
Per pt sts she woke up this am with right swollen eye. sts painful and a little blurry. sts she thinks she has a sty.

## 2013-04-25 NOTE — ED Provider Notes (Signed)
CSN: [id number]     Arrival date & time 04/25/13  1118 History  This chart was scribed for non-physician practitioner working with Burgess AmorJulie Dann Galicia, by Tana ConchStephen Methvin ED Scribe. This patient was seen in TR04C/TR04C and the patient's care was started at 1:15 PM.    Chief Complaint  Patient presents with  . Eye Problem      The history is provided by the patient. No language interpreter was used.    HPI Comments: Debbie Frederick is a 23 y.o. female who presents to the Emergency Department complaining of right eye swelling. Pt reports that when she woke this morning, her right lower eyelid almost swollen shut, but has since resolved after applying warm compresses. She reports that her eye was "really watery" on the ride here, she does not know if that was her eye draining. She reports that yesterday her eye was irritated feeling and that the swelling occurred overnight. She also reports that her vision has been getting worse over the past month, having difficulty reading and using a computer.  She is a full time Consulting civil engineerstudent.  She has not had a formal eye exam in over 9 years. Pt does not wear glasses or contacts and has not tried readers for close up work.  Past Medical History  Diagnosis Date  . Anemia   . Bronchitis   . Herpes genitalia    Past Surgical History  Procedure Laterality Date  . Hernia repair    . Induced abortion     History reviewed. No pertinent family history. History  Substance Use Topics  . Smoking status: Never Smoker   . Smokeless tobacco: Not on file  . Alcohol Use: Yes   OB History   Grav Para Term Preterm Abortions TAB SAB Ect Mult Living                 Review of Systems  Constitutional: Negative for appetite change and fatigue.  HENT: Negative for congestion, ear discharge and sinus pressure.   Eyes: Positive for pain, discharge (minimal) and visual disturbance.  Respiratory: Negative for cough.   Cardiovascular: Negative for chest pain.  Gastrointestinal:  Negative for abdominal pain and diarrhea.  Genitourinary: Negative for frequency and hematuria.  Musculoskeletal: Negative for back pain.  Skin: Negative for rash.  Neurological: Negative for seizures and headaches.  Psychiatric/Behavioral: Negative for hallucinations.      Allergies  Review of patient's allergies indicates no known allergies.  Home Medications   Current Outpatient Rx  Name  Route  Sig  Dispense  Refill  . ibuprofen (ADVIL,MOTRIN) 200 MG tablet   Oral   Take 400 mg by mouth 4 (four) times daily as needed for headache.          . Multiple Vitamins-Minerals (HAIR VITAMINS PO)   Oral   Take 1 tablet by mouth daily.         [place]. PRESCRIPTION MEDICATION   Oral   Take 0.5 tablets by mouth daily as needed (pain). Pain medication          BP 116/71  Pulse 86  Temp(Src) 97.7 F (36.5 C) (Oral)  Resp 20  SpO2 100%  LMP 03/27/2013 Physical Exam  Nursing note and vitals reviewed. Constitutional: She appears well-developed and well-nourished.  HENT:  Head: Normocephalic and atraumatic.  Eyes: EOM are normal. Pupils are equal, round, and reactive to light. Right eye exhibits no chemosis and no discharge. Right conjunctiva is not injected.  Right eye  Small sty on her  right lower mucosal eyelid, without pointing or drainage. Mildly ttp along her right lateral lower lid with slight visible edema at this location.   Visual Acuity - Bilateral Distance: 20/30 ; R Distance: 20/30 ; L Distance: 20/30   Neck: Normal range of motion.  Cardiovascular: Normal rate, regular rhythm, normal heart sounds and intact distal pulses.   Pulmonary/Chest: Effort normal and breath sounds normal. She has no wheezes.  Abdominal: Soft. Bowel sounds are normal. There is no tenderness.  Musculoskeletal: Normal range of motion.  Neurological: She is alert.  Skin: Skin is warm and dry.  Psychiatric: She has a normal mood and affect.    ED Course  Procedures (including critical  care time)  DIAGNOSTIC STUDIES: Oxygen Saturation is 100% on RA, normal by my interpretation.    COORDINATION OF CARE:   1:25 PM-Discussed treatment plan which includes applying an ointment to her right eye  with pt at bedside and pt agreed to plan.   Labs Review Labs Reviewed - No data to display Imaging Review No results found.   EKG Interpretation None      MDM   Final diagnoses:  Stye    Encouraged continued warm compresses.  Pt was given tobramycin ophthalmic ointment for bid use.  Referral to ophthalmologist for recheck if not improving and also for formal vision exam.  Prn f/u anticipated here.  I personally performed the services described in this documentation, which was scribed in my presence. The recorded information has been reviewed and is accurate.    Burgess Amor, PA-C 04/26/13 1059

## 2013-04-25 NOTE — Discharge Instructions (Signed)
Sty A sty (hordeolum) is an infection of a gland in the eyelid located at the base of the eyelash. A sty may develop a white or yellow head of pus. It can be puffy (swollen). Usually, the sty will burst and pus will come out on its own. They do not leave lumps in the eyelid once they drain. A sty is often confused with another form of cyst of the eyelid called a chalazion. Chalazions occur within the eyelid and not on the edge where the bases of the eyelashes are. They often are red, sore and then form firm lumps in the eyelid. CAUSES   Germs (bacteria).  Lasting (chronic) eyelid inflammation. SYMPTOMS   Tenderness, redness and swelling along the edge of the eyelid at the base of the eyelashes.  Sometimes, there is a white or yellow head of pus. It may or may not drain. DIAGNOSIS  An ophthalmologist will be able to distinguish between a sty and a chalazion and treat the condition appropriately.  TREATMENT   Styes are typically treated with warm packs (compresses) until drainage occurs.  In rare cases, medicines that kill germs (antibiotics) may be prescribed. These antibiotics may be in the form of drops, cream or pills.  If a hard lump has formed, it is generally necessary to do a small incision and remove the hardened contents of the cyst in a minor surgical procedure done in the office.  In suspicious cases, your caregiver may send the contents of the cyst to the lab to be certain that it is not a rare, but dangerous form of cancer of the glands of the eyelid. HOME CARE INSTRUCTIONS   Wash your hands often and dry them with a clean towel. Avoid touching your eyelid. This may spread the infection to other parts of the eye.  Apply heat to your eyelid for 10 to 20 minutes, several times a day, to ease pain and help to heal it faster.  Do not squeeze the sty. Allow it to drain on its own. Wash your eyelid carefully 3 to 4 times per day to remove any pus. SEEK IMMEDIATE MEDICAL CARE IF:    Your eye becomes painful or puffy (swollen).  Your vision changes.  Your sty does not drain by itself within 3 days.  Your sty comes back within a short period of time, even with treatment.  You have redness (inflammation) around the eye.  You have a fever. Document Released: 10/15/2004 Document Revised: 03/30/2011 Document Reviewed: 06/19/2008 ExitCare Patient Information 2014 ExitCare, LLC.  

## 2013-04-27 NOTE — ED Provider Notes (Signed)
Medical screening examination/treatment/procedure(s) were performed by non-physician practitioner and as supervising physician I was immediately available for consultation/collaboration.   EKG Interpretation None        Marijayne Rauth T Monica Codd, MD 04/27/13 0706 

## 2013-11-28 ENCOUNTER — Inpatient Hospital Stay (HOSPITAL_COMMUNITY): Payer: BC Managed Care – PPO

## 2013-11-28 ENCOUNTER — Inpatient Hospital Stay (HOSPITAL_COMMUNITY)
Admission: AD | Admit: 2013-11-28 | Discharge: 2013-11-28 | Disposition: A | Discharge status: Home / Self Care | Payer: BC Managed Care – PPO | Source: Ambulatory Visit | Attending: Obstetrics & Gynecology | Admitting: Obstetrics & Gynecology

## 2013-11-28 ENCOUNTER — Encounter (HOSPITAL_COMMUNITY): Payer: Self-pay | Admitting: *Deleted

## 2013-11-28 DIAGNOSIS — B9689 Other specified bacterial agents as the cause of diseases classified elsewhere: Secondary | ICD-10-CM | POA: Diagnosis not present

## 2013-11-28 DIAGNOSIS — R197 Diarrhea, unspecified: Secondary | ICD-10-CM

## 2013-11-28 DIAGNOSIS — R102 Pelvic and perineal pain: Secondary | ICD-10-CM

## 2013-11-28 DIAGNOSIS — R112 Nausea with vomiting, unspecified: Secondary | ICD-10-CM

## 2013-11-28 DIAGNOSIS — N760 Acute vaginitis: Secondary | ICD-10-CM | POA: Diagnosis not present

## 2013-11-28 DIAGNOSIS — Z8742 Personal history of other diseases of the female genital tract: Secondary | ICD-10-CM

## 2013-11-28 HISTORY — DX: Adverse effect of unspecified anesthetic, initial encounter: T41.45XA

## 2013-11-28 HISTORY — DX: Other complications of anesthesia, initial encounter: T88.59XA

## 2013-11-28 LAB — WET PREP, GENITAL
TRICH WET PREP: NONE SEEN
YEAST WET PREP: NONE SEEN

## 2013-11-28 LAB — CBC
HCT: 41.4 % (ref 36.0–46.0)
Hemoglobin: 14.1 g/dL (ref 12.0–15.0)
MCH: 28.9 pg (ref 26.0–34.0)
MCHC: 34.1 g/dL (ref 30.0–36.0)
MCV: 84.8 fL (ref 78.0–100.0)
PLATELETS: 218 10*3/uL (ref 150–400)
RBC: 4.88 MIL/uL (ref 3.87–5.11)
RDW: 12.3 % (ref 11.5–15.5)
WBC: 4.2 10*3/uL (ref 4.0–10.5)

## 2013-11-28 LAB — URINALYSIS, ROUTINE W REFLEX MICROSCOPIC
Bilirubin Urine: NEGATIVE
Glucose, UA: NEGATIVE mg/dL
Hgb urine dipstick: NEGATIVE
Ketones, ur: NEGATIVE mg/dL
Leukocytes, UA: NEGATIVE
NITRITE: NEGATIVE
PH: 6.5 (ref 5.0–8.0)
Protein, ur: NEGATIVE mg/dL
SPECIFIC GRAVITY, URINE: 1.025 (ref 1.005–1.030)
Urobilinogen, UA: 0.2 mg/dL (ref 0.0–1.0)

## 2013-11-28 LAB — POCT PREGNANCY, URINE: PREG TEST UR: NEGATIVE

## 2013-11-28 LAB — HIV ANTIBODY (ROUTINE TESTING W REFLEX): HIV: NONREACTIVE

## 2013-11-28 MED ORDER — ONDANSETRON HCL 4 MG PO TABS: 4.0000 mg | ORAL_TABLET | Freq: Three times a day (TID) | ORAL | Status: DC | PRN

## 2013-11-28 MED ORDER — METRONIDAZOLE 0.75 % VA GEL: 1.0000 | Freq: Every day | VAGINAL | Status: DC

## 2013-11-28 NOTE — MAU Provider Note (Signed)
History     CSN: [id number]  Arrival date and time: 11/28/13 1255   First Provider Initiated Contact with Patient 11/28/13 1418      Chief Complaint  Patient presents with  . Possible Pregnancy  . Emesis  . Diarrhea   HPI  Pt is a 23 y/o G1P0010 who presents today complaining of nausea, vomiting, and diarrhea. She reports the nausea started on Friday which developed into vomiting on Saturday. She reports vomiting 4-5x/day and is unable to keep any food down. She reports some streaks of blood in her emesis. She reports abdominal pain this weekend that has resolved. She reports 3 episodes of diarrhea/day. She noticed blood in her diarrhea on Sunday and again today. She says the blood is in the toilet and also on the toilet paper when she wipes. She says it is not enough blood to change the toilet water color. She endorses headaches and reports vaginal spotting today but says it was not enough to need a panty liner. She denies any fever, chills, dizziness, dysuria, hematuria, or vaginal discharge. She says she was seen at [facility] in June 2014 for similar abdominal pain and N/V. She reports begin diagnosed with a left ovarian cyst and was instructed to follow up but she did not. She reports her LMP was 9/12. She says her periods are irregular and she has one about every 3 months. She is sexually active. She has had 2 partners in the past year. She is not using any form of birth control. She has a history of Chlamydia and HSVII.   Past Medical History  Diagnosis Date  . Anemia   . Bronchitis   . Herpes genitalia   . Complication of anesthesia     "Hard to wake me up"    Past Surgical History  Procedure Laterality Date  . Hernia repair    . Induced abortion      No family history on file.  History  Substance Use Topics  . Smoking status: Never Smoker   . Smokeless tobacco: Not on file  . Alcohol Use: Yes     Comment: occas    Allergies: No Known  Allergies  Prescriptions prior to admission  Medication Sig Dispense Refill Last Dose  . ibuprofen (ADVIL,MOTRIN) 200 MG tablet Take 400 mg by mouth 4 (four) times daily as needed for headache.    04/25/2013 at Unknown time  . Multiple Vitamins-Minerals (HAIR VITAMINS PO) Take 1 tablet by mouth daily.   Past Week at Unknown time  . PRESCRIPTION MEDICATION Take 0.5 tablets by mouth daily as needed (pain). Pain medication   04/25/2013 at Unknown time    Review of Systems  Constitutional: Negative for fever and chills.  Respiratory: Negative for shortness of breath.   Cardiovascular: Negative for chest pain.  Gastrointestinal: Positive for nausea, vomiting, abdominal pain, diarrhea and blood in stool.  Genitourinary: Negative for dysuria, urgency and hematuria.  Neurological: Positive for headaches. Negative for dizziness and loss of consciousness.   Physical Exam   Blood pressure 114/71, pulse 62, temperature 98.2 F (36.8 C), temperature source Oral, resp. rate 16, height 5' 6.5" (1.689 m), weight 49.624 kg (109 lb 6.4 oz), last menstrual period 09/30/2013, SpO2 100 %.  Physical Exam  Constitutional: She is oriented to person, place, and time. She appears well-developed and well-nourished.  Cardiovascular: Normal rate, regular rhythm and normal heart sounds.   Respiratory: Effort normal and breath sounds normal. No respiratory distress. She has no wheezes.  She has no rales.  GI: Soft. Bowel sounds are normal. She exhibits no distension. There is tenderness. There is no rebound and no guarding.  LUQ and LLQ tender to palpation   Genitourinary: Vagina normal and uterus normal. Uterus is not tender. Cervix exhibits no motion tenderness, no discharge and no friability. Right adnexum displays no mass, no tenderness and no fullness. Left adnexum displays no mass, no tenderness and no fullness. No erythema, tenderness or bleeding in the vagina. No vaginal discharge found.  Minimal cervical bleeding.    Musculoskeletal: She exhibits no edema or tenderness.  Neurological: She is alert and oriented to person, place, and time.    MAU Course  Procedures  Negative UPT  U/A: WNL  CBC: WNL  Wet prep: few clue cells  GC/C: pending  U/S: moderate amount of simple free fluid  Results for orders placed or performed during the hospital encounter of 11/28/13 (from the past 72 hour(s))  Urinalysis, Routine w reflex microscopic     Status: None   Collection Time: 11/28/13  1:15 PM  Result Value Ref Range   Color, Urine YELLOW YELLOW   APPearance CLEAR CLEAR   Specific Gravity, Urine 1.025 1.005 - 1.030   pH 6.5 5.0 - 8.0   Glucose, UA NEGATIVE NEGATIVE mg/dL   Hgb urine dipstick NEGATIVE NEGATIVE   Bilirubin Urine NEGATIVE NEGATIVE   Ketones, ur NEGATIVE NEGATIVE mg/dL   Protein, ur NEGATIVE NEGATIVE mg/dL   Urobilinogen, UA 0.2 0.0 - 1.0 mg/dL   Nitrite NEGATIVE NEGATIVE   Leukocytes, UA NEGATIVE NEGATIVE    Comment: MICROSCOPIC NOT DONE ON URINES WITH NEGATIVE PROTEIN, BLOOD, LEUKOCYTES, NITRITE, OR GLUCOSE <1000 mg/dL.  Wet prep, genital     Status: Abnormal   Collection Time: 11/28/13  1:15 PM  Result Value Ref Range   Yeast Wet Prep HPF POC NONE SEEN NONE SEEN   Trich, Wet Prep NONE SEEN NONE SEEN   Clue Cells Wet Prep HPF POC FEW (A) NONE SEEN   WBC, Wet Prep HPF POC FEW (A) NONE SEEN    Comment: MANY BACTERIA SEEN  Pregnancy, urine POC     Status: None   Collection Time: 11/28/13  1:22 PM  Result Value Ref Range   Preg Test, Ur NEGATIVE NEGATIVE    Comment:        THE SENSITIVITY OF THIS METHODOLOGY IS >24 mIU/mL   CBC     Status: None   Collection Time: 11/28/13  2:56 PM  Result Value Ref Range   WBC 4.2 4.0 - 10.5 K/uL   RBC 4.88 3.87 - 5.11 MIL/uL   Hemoglobin 14.1 12.0 - 15.0 g/dL   HCT 86.541.4 78.436.0 - 69.646.0 %   MCV 84.8 78.0 - 100.0 fL   MCH 28.9 26.0 - 34.0 pg   MCHC 34.1 30.0 - 36.0 g/dL   RDW 29.512.3 28.411.5 - 13.215.5 %   Platelets 218 150 - 400 K/uL        Assessment and Plan  A: 23 y/o G1P0010 with nausea, vomiting, and diarrhea secondary to possible ovarian cyst rupture and bacterial vaginitis.   P: D/C home  - Rx: Zofran 4 mg q 8hs prn nausea and vomiting  - Rx: Metrogel 0.75% 1 applicatorful vaginally at bedtime x 5 days   Gel rather than po flagyl due to pt's nausea and vomiting  - Ibuprofen 600 mg q 8hrs prn for pain  - F/u with PCP as needed  - Return to MAU for OBGYN  emergency   Janalyn RouseHoward, Tayla 11/28/2013, 2:36 PM   I have seen this patient and agree with the above PA student's note.  LEFTWICH-KIRBY, LISA Certified Nurse-Midwife

## 2013-11-28 NOTE — MAU Note (Signed)
Patient states she has had 2 negative home pregnancy tests. Started vomiting on 11-7 and has not kept anything down since then. Has had pelvic pain but not today. Has had spotting today with wiping. Loose stools.

## 2013-11-28 NOTE — Discharge Instructions (Signed)
Ovarian Cyst An ovarian cyst is a fluid-filled sac that forms on an ovary. The ovaries are small organs that produce eggs in women. Various types of cysts can form on the ovaries. Most are not cancerous. Many do not cause problems, and they often go away on their own. Some may cause symptoms and require treatment. Common types of ovarian cysts include:  Functional cysts--These cysts may occur every month during the menstrual cycle. This is normal. The cysts usually go away with the next menstrual cycle if the woman does not get pregnant. Usually, there are no symptoms with a functional cyst.  Endometrioma cysts--These cysts form from the tissue that lines the uterus. They are also called "chocolate cysts" because they become filled with blood that turns brown. This type of cyst can cause pain in the lower abdomen during intercourse and with your menstrual period.  Cystadenoma cysts--This type develops from the cells on the outside of the ovary. These cysts can get very big and cause lower abdomen pain and pain with intercourse. This type of cyst can twist on itself, cut off its blood supply, and cause severe pain. It can also easily rupture and cause a lot of pain.  Dermoid cysts--This type of cyst is sometimes found in both ovaries. These cysts may contain different kinds of body tissue, such as skin, teeth, hair, or cartilage. They usually do not cause symptoms unless they get very big.  Theca lutein cysts--These cysts occur when too much of a certain hormone (human chorionic gonadotropin) is produced and overstimulates the ovaries to produce an egg. This is most common after procedures used to assist with the conception of a baby (in vitro fertilization). CAUSES   Fertility drugs can cause a condition in which multiple large cysts are formed on the ovaries. This is called ovarian hyperstimulation syndrome.  A condition called polycystic ovary syndrome can cause hormonal imbalances that can lead to  nonfunctional ovarian cysts. SIGNS AND SYMPTOMS  Many ovarian cysts do not cause symptoms. If symptoms are present, they may include:  Pelvic pain or pressure.  Pain in the lower abdomen.  Pain during sexual intercourse.  Increasing girth (swelling) of the abdomen.  Abnormal menstrual periods.  Increasing pain with menstrual periods.  Stopping having menstrual periods without being pregnant. DIAGNOSIS  These cysts are commonly found during a routine or annual pelvic exam. Tests may be ordered to find out more about the cyst. These tests may include:  Ultrasound.  X-ray of the pelvis.  CT scan.  MRI.  Blood tests. TREATMENT  Many ovarian cysts go away on their own without treatment. Your health care provider may want to check your cyst regularly for 2-3 months to see if it changes. For women in menopause, it is particularly important to monitor a cyst closely because of the higher rate of ovarian cancer in menopausal women. When treatment is needed, it may include any of the following:  A procedure to drain the cyst (aspiration). This may be done using a long needle and ultrasound. It can also be done through a laparoscopic procedure. This involves using a thin, lighted tube with a tiny camera on the end (laparoscope) inserted through a small incision.  Surgery to remove the whole cyst. This may be done using laparoscopic surgery or an open surgery involving a larger incision in the lower abdomen.  Hormone treatment or birth control pills. These methods are sometimes used to help dissolve a cyst. HOME CARE INSTRUCTIONS   Only take over-the-counter   or prescription medicines as directed by your health care provider.  Follow up with your health care provider as directed.  Get regular pelvic exams and Pap tests. SEEK MEDICAL CARE IF:   Your periods are late, irregular, or painful, or they stop.  Your pelvic pain or abdominal pain does not go away.  Your abdomen becomes  larger or swollen.  You have pressure on your bladder or trouble emptying your bladder completely.  You have pain during sexual intercourse.  You have feelings of fullness, pressure, or discomfort in your stomach.  You lose weight for no apparent reason.  You feel generally ill.  You become constipated.  You lose your appetite.  You develop acne.  You have an increase in body and facial hair.  You are gaining weight, without changing your exercise and eating habits.  You think you are pregnant. SEEK IMMEDIATE MEDICAL CARE IF:   You have increasing abdominal pain.  You feel sick to your stomach (nauseous), and you throw up (vomit).  You develop a fever that comes on suddenly.  You have abdominal pain during a bowel movement.  Your menstrual periods become heavier than usual. MAKE SURE YOU:  Understand these instructions.  Will watch your condition.  Will get help right away if you are not doing well or get worse. Document Released: 01/05/2005 Document Revised: 01/10/2013 Document Reviewed: 09/12/2012 ExitCare Patient Information 2015 [employer]. This information is not intended to replace advice given to you by your health care provider. Make sure you discuss any questions you have with your health care provider.  

## 2013-11-29 LAB — GC/CHLAMYDIA PROBE AMP
CT Probe RNA: NEGATIVE
GC Probe RNA: NEGATIVE

## 2014-01-30 ENCOUNTER — Inpatient Hospital Stay (HOSPITAL_COMMUNITY)
Admission: AD | Admit: 2014-01-30 | Discharge: 2014-01-30 | Disposition: A | Discharge status: Home / Self Care | Payer: BLUE CROSS/BLUE SHIELD | Source: Ambulatory Visit | Attending: Obstetrics & Gynecology | Admitting: Obstetrics & Gynecology

## 2014-01-30 ENCOUNTER — Encounter (HOSPITAL_COMMUNITY): Payer: Self-pay | Admitting: *Deleted

## 2014-01-30 DIAGNOSIS — N940 Mittelschmerz: Secondary | ICD-10-CM

## 2014-01-30 DIAGNOSIS — R109 Unspecified abdominal pain: Secondary | ICD-10-CM | POA: Diagnosis present

## 2014-01-30 HISTORY — DX: Unspecified ovarian cyst, unspecified side: N83.209

## 2014-01-30 LAB — URINALYSIS, ROUTINE W REFLEX MICROSCOPIC
BILIRUBIN URINE: NEGATIVE
GLUCOSE, UA: NEGATIVE mg/dL
Hgb urine dipstick: NEGATIVE
KETONES UR: NEGATIVE mg/dL
LEUKOCYTES UA: NEGATIVE
Nitrite: NEGATIVE
PROTEIN: NEGATIVE mg/dL
Specific Gravity, Urine: 1.015 (ref 1.005–1.030)
Urobilinogen, UA: 0.2 mg/dL (ref 0.0–1.0)
pH: 6 (ref 5.0–8.0)

## 2014-01-30 LAB — POCT PREGNANCY, URINE: Preg Test, Ur: NEGATIVE

## 2014-01-30 MED ORDER — NORGESTIMATE-ETH ESTRADIOL 0.25-35 MG-MCG PO TABS: 1.0000 | ORAL_TABLET | Freq: Every day | ORAL | Status: AC

## 2014-01-30 NOTE — Discharge Instructions (Signed)
Mittelschmerz   Mittelschmerz is lower abdominal pain that happens between menstrual periods. Mittelschmerz is a German word that means "middle pain." It may occur right before, during, or after ovulation. It is usually felt on either the right or left side, depending on which ovary is passing the egg.   CAUSES   Pain may be felt when:  · There is irritation (inflammation) inside the abdomen. This is caused by the small amount of blood or fluid that may come from releasing the egg.  · The covering of the ovary stretches.  · Ovarian cysts develop.  · You have endometriosis. This is when the uterine lining tissue grows outside of the uterus.  · You have endometriomas. These are cysts that are formed by endometrial tissue.  SYMPTOMS   Pain may be:  · One-sided pain unless both ovaries are ovulating at the same time. If both ovaries are ovulating, there may be pain on both sides. This pain is often repeated every month. At times, there may be a month or two with no pain.  · Dull, cramping, or sharp.  · Short-lived or last up to 24 to 48 hours.  · Felt with bowel movements, diarrhea, or intercourse.  · Accompanied by a slight amount of vaginal bleeding.  DIAGNOSIS   · Your caregiver will take a history and do a physical exam.  · Blood tests and abdominal ultrasounds may be performed if the problem continues, becomes worse, or does not respond to the usual treatment.  · A thin, lighted tube may be put into your abdomen (laparoscopy) to check for problems if the pain gets worse or does not go away.  TREATMENT   Usually, no treatment is needed. If treatment is needed, it may include:  · Taking over-the-counter pain relievers.  · Taking birth control pills (oral contraceptives). This may be used to stop ovulation.  · Medical or surgical treatment if you have endometriomas.  Together, you and your caregiver can decide which course of treatment is best for you.  HOME CARE INSTRUCTIONS   · Only take over-the-counter or  prescription medicines for pain, discomfort, or fever as directed by your caregiver. Do not use aspirin. Aspirin may increase bleeding.  · Write down when the pain comes in relation to your menstrual period. Write down how bad it is, if you have a fever with the pain, and how long it lasts.  SEEK MEDICAL CARE IF:   · Your pain increases and is not controlled with medicine.  · Your pain is on both sides of your abdomen.  · You develop vaginal bleeding (more than just spotting) with the pain.  · You have a fever.  · You develop nausea or vomiting.  · You feel lightheaded or faint.  MAKE SURE YOU:   · Understand these instructions.  · Will watch your condition.  · Will get help right away if you are not doing well or get worse.  Document Released: 12/26/2001 Document Revised: 03/30/2011 Document Reviewed: 04/04/2010  ExitCare® Patient Information ©2015 [employer]. This information is not intended to replace advice given to you by your health care provider. Make sure you discuss any questions you have with your health care provider.

## 2014-01-30 NOTE — MAU Provider Note (Signed)
Chief Complaint: Abdominal Pain   First Provider Initiated Contact with Patient 01/30/14 1456     SUBJECTIVE HPI: Debbie Frederick is a 24 y.o. G1P0010 who presents to maternity admissions reporting recurrent abdominal pain. States the pain is localized to suprapubic region, episodes last 3 min and are intense and sharp. Episodes recur 2-3 times per day for about 2 days. Denies any associated back pain, vaginal d/c or bleeding. Endorses urinary frequency, but denies dysuria, urgency, fever or chills. No diarrhea, but chronic constipation. Has not tried any treatment. Had episodes of the pain before and was diagnosed with possible ovarian cyst in November 2015. LMP 11/30/2013, menses are irregular occurring every few months. Last pregnancy 6 years ago, terminated.   Past Medical History  Diagnosis Date  . Anemia   . Bronchitis   . Herpes genitalia   . Complication of anesthesia     "Hard to wake me up"  . Ovarian cyst    Past Surgical History  Procedure Laterality Date  . Hernia repair    . Induced abortion     History   Social History  . Marital Status: Single    Spouse Name: N/A    Number of Children: N/A  . Years of Education: N/A   Occupational History  . Not on file.   Social History Main Topics  . Smoking status: Never Smoker   . Smokeless tobacco: Not on file  . Alcohol Use: Yes     Comment: occas  . Drug Use: No  . Sexual Activity: Yes    Birth Control/ Protection: None   Other Topics Concern  . Not on file   Social History Narrative   No current facility-administered medications on file prior to encounter.   Current Outpatient Prescriptions on File Prior to Encounter  Medication Sig Dispense Refill  . ibuprofen (ADVIL,MOTRIN) 200 MG tablet Take 400 mg by mouth 4 (four) times daily as needed for headache.     . metroNIDAZOLE (METROGEL) 0.75 % vaginal gel Place 1 Applicatorful vaginally at bedtime. Apply one applicatorful to vagina at bedtime for 5 days  (Patient not taking: Reported on 01/30/2014) 70 g 1  . ondansetron (ZOFRAN) 4 MG tablet Take 1 tablet (4 mg total) by mouth every 8 (eight) hours as needed for nausea or vomiting. (Patient not taking: Reported on 01/30/2014) 20 tablet 0   No Known Allergies  ROS: Pertinent items in HPI  OBJECTIVE Blood pressure 116/69, pulse 92, temperature 98.3 F (36.8 C), temperature source Oral, resp. rate 18, height 5\' 6"  (1.676 m), weight 114 lb 2 oz (51.767 kg), last menstrual period 11/29/2013, SpO2 100 %. GENERAL: Well-developed, well-nourished female in no acute distress.  HEENT: Normocephalic HEART: normal rate RESP: normal effort ABDOMEN: Soft, non-tender, mild midline tenderness. No rebound or guarding. No CVA tenderness.  EXTREMITIES: Nontender, no edema NEURO: Alert and oriented SPECULUM EXAM: Vaginal mucosa pink. Scant amount of whitish thin D/C. Cervix pink, non-friable. No cervical D/C or bleeding.  BIMANUAL: Cervix mobile, uterus normal size, no adnexal tenderness or masses. No CMT.   LAB RESULTS Results for orders placed or performed during the hospital encounter of 01/30/14 (from the past 24 hour(s))  Urinalysis, Routine w reflex microscopic     Status: None   Collection Time: 01/30/14  1:03 PM  Result Value Ref Range   Color, Urine YELLOW YELLOW   APPearance CLEAR CLEAR   Specific Gravity, Urine 1.015 1.005 - 1.030   pH 6.0 5.0 - 8.0   Glucose,  UA NEGATIVE NEGATIVE mg/dL   Hgb urine dipstick NEGATIVE NEGATIVE   Bilirubin Urine NEGATIVE NEGATIVE   Ketones, ur NEGATIVE NEGATIVE mg/dL   Protein, ur NEGATIVE NEGATIVE mg/dL   Urobilinogen, UA 0.2 0.0 - 1.0 mg/dL   Nitrite NEGATIVE NEGATIVE   Leukocytes, UA NEGATIVE NEGATIVE  Pregnancy, urine POC     Status: None   Collection Time: 01/30/14  1:08 PM  Result Value Ref Range   Preg Test, Ur NEGATIVE NEGATIVE    IMAGING None   ASSESSMENT 1. Ovulatory pain     PLAN Discharge home Discussed ovulatory pain presentation. Rx  for Sprintec given with 11 refills. Pt to f/u with [facility] or gyn once care established.     Medication List    ASK your doctor about these medications        ibuprofen 200 MG tablet  Commonly known as:  ADVIL,MOTRIN  Take 400 mg by mouth 4 (four) times daily as needed for headache.     metroNIDAZOLE 0.75 % vaginal gel  Commonly known as:  METROGEL  Place 1 Applicatorful vaginally at bedtime. Apply one applicatorful to vagina at bedtime for 5 days     ondansetron 4 MG tablet  Commonly known as:  ZOFRAN  Take 1 tablet (4 mg total) by mouth every 8 (eight) hours as needed for nausea or vomiting.     vitamin C 250 MG tablet  Commonly known as:  ASCORBIC ACID  Take 250 mg by mouth daily.       Follow-up Information    [facility]   Why:  As needed, for f/u on birth control     Tobi Bastos Misior, PA-S 01/30/2014 4:20 PM    I have seen this patient and agree with the above PA student's note.   Sharen Counter Certified Nurse-Midwife 01/30/2014  4:20 PM

## 2014-01-30 NOTE — MAU Note (Signed)
Negative HPT's x 4.

## 2014-01-30 NOTE — MAU Note (Signed)
Pt seen in MAU approx one month ago for hx of cysts, no cysts seen on U/s, but had fluid.  Lower abd pain started again around 1/4, denies bleeding or discharge.  No dysuria.

## 2014-05-02 ENCOUNTER — Emergency Department (HOSPITAL_COMMUNITY)
Admission: EM | Admit: 2014-05-02 | Discharge: 2014-05-02 | Disposition: A | Discharge status: Home / Self Care | Payer: BLUE CROSS/BLUE SHIELD | Source: Home / Self Care

## 2014-05-02 ENCOUNTER — Inpatient Hospital Stay (HOSPITAL_COMMUNITY)
Admission: EM | Admit: 2014-05-02 | Discharge: 2014-05-02 | Disposition: A | Discharge status: Home / Self Care | Payer: BLUE CROSS/BLUE SHIELD | Source: Ambulatory Visit | Attending: Obstetrics & Gynecology | Admitting: Obstetrics & Gynecology

## 2014-05-02 ENCOUNTER — Encounter (HOSPITAL_COMMUNITY): Payer: Self-pay | Admitting: *Deleted

## 2014-05-02 DIAGNOSIS — A499 Bacterial infection, unspecified: Secondary | ICD-10-CM

## 2014-05-02 DIAGNOSIS — B9689 Other specified bacterial agents as the cause of diseases classified elsewhere: Secondary | ICD-10-CM | POA: Diagnosis not present

## 2014-05-02 DIAGNOSIS — N760 Acute vaginitis: Secondary | ICD-10-CM | POA: Insufficient documentation

## 2014-05-02 DIAGNOSIS — N939 Abnormal uterine and vaginal bleeding, unspecified: Secondary | ICD-10-CM | POA: Insufficient documentation

## 2014-05-02 LAB — URINALYSIS, ROUTINE W REFLEX MICROSCOPIC
BILIRUBIN URINE: NEGATIVE
Glucose, UA: NEGATIVE mg/dL
Hgb urine dipstick: NEGATIVE
KETONES UR: NEGATIVE mg/dL
Leukocytes, UA: NEGATIVE
NITRITE: NEGATIVE
PH: 7 (ref 5.0–8.0)
PROTEIN: NEGATIVE mg/dL
Specific Gravity, Urine: 1.02 (ref 1.005–1.030)
UROBILINOGEN UA: 1 mg/dL (ref 0.0–1.0)

## 2014-05-02 LAB — WET PREP, GENITAL
TRICH WET PREP: NONE SEEN
YEAST WET PREP: NONE SEEN

## 2014-05-02 LAB — POCT PREGNANCY, URINE: PREG TEST UR: NEGATIVE

## 2014-05-02 MED ORDER — METRONIDAZOLE 500 MG PO TABS: 500.0000 mg | ORAL_TABLET | Freq: Two times a day (BID) | ORAL | Status: DC

## 2014-05-02 NOTE — MAU Note (Signed)
Woke up @ 0500 this a.m. Cramping.  Began having light bleeding later, not time for period.  Thick discharge for the last 2 weeks, no odor or itching.

## 2014-05-02 NOTE — MAU Provider Note (Signed)
History     CSN: [id number]  Arrival date and time: 05/02/14 1116   First Provider Initiated Contact with Patient 05/02/14 1315      Chief Complaint  Patient presents with  . Abdominal Pain  . Vaginal Bleeding   HPI Debbie Frederick 24 y.o. G1P0010 nonpregnant female presents to MAU complaining of vaginal bleeding and cramping that started early this morning.  She is concerned because it is not time for her period.  She last had sex on 4/10 and no condom was used.  Cramping is 4/10.  No aggravating or alleviating factors.  No medication used.  Has not eaten today.  Is not using contraception or hormonal treatments.   She notes abdominal pain and denies weakness, fever, dysuria, diarrhea, constipation, back pain, headache.   OB History    Gravida Para Term Preterm AB TAB SAB Ectopic Multiple Living   0      Past Medical History  Diagnosis Date  . Anemia   . Bronchitis   . Herpes genitalia   . Complication of anesthesia     "Hard to wake me up"  . Ovarian cyst     Past Surgical History  Procedure Laterality Date  . Hernia repair    . Induced abortion      History reviewed. No pertinent family history.  History  Substance Use Topics  . Smoking status: Never Smoker   . Smokeless tobacco: Not on file  . Alcohol Use: Yes     Comment: occas    Allergies: No Known Allergies  Prescriptions prior to admission  Medication Sig Dispense Refill Last Dose  . ibuprofen (ADVIL,MOTRIN) 200 MG tablet Take 400 mg by mouth 4 (four) times daily as needed for headache.    Past Week at Unknown time  . norgestimate-ethinyl estradiol (ORTHO-CYCLEN,SPRINTEC,PREVIFEM) 0.25-35 MG-MCG tablet Take 1 tablet by mouth daily. 1 Package 11   . vitamin C (ASCORBIC ACID) 250 MG tablet Take 250 mg by mouth daily.   Past Month at Unknown time    ROS Pertinent ROS in HPI  Physical Exam   Blood pressure 110/75, pulse 77, temperature 97.9 F (36.6 C), temperature source Oral, resp.  rate 16, height  (1.651 m), weight 110 lb (49.896 kg), last menstrual period 04/10/2014.  Physical Exam  Constitutional: She is oriented to person, place, and time. She appears well-developed and well-nourished. No distress.  HENT:  Head: Normocephalic and atraumatic.  Eyes: EOM are normal.  Neck: Normal range of motion.  Cardiovascular: Normal rate and regular rhythm.   Respiratory: Effort normal and breath sounds normal.  GI: Soft. Bowel sounds are normal. She exhibits no distension. There is no tenderness. There is no rebound and no guarding.  Genitourinary:  Copious white/grey malodorous discharge Cervix is smooth.  No CMT, no adnexal mass or tenderness No bleeding noted  Musculoskeletal: Normal range of motion.  Neurological: She is alert and oriented to person, place, and time.  Skin: Skin is warm and dry.  Psychiatric: She has a normal mood and affect.    MAU Course  Procedures  MDM Clinical exam consistent with BV.  Supported by [employer] prep.   No evidence of bleeding   Assessment and Plan  A: Bacterial vaginosis P: Discharge to home Flagyl x 1 week No etoh/IC x 10 days Condoms for all IC F/u with GYN or health department for contraception Patient may return to MAU as needed or if her condition  were to change or worsen   Bertram Denvereague Clark, Karen E 05/02/2014, 1:16 PM

## 2014-05-02 NOTE — Discharge Instructions (Signed)
Bacterial Vaginosis Bacterial vaginosis is a vaginal infection that occurs when the normal balance of bacteria in the vagina is disrupted. It results from an overgrowth of certain bacteria. This is the most common vaginal infection in women of childbearing age. Treatment is important to prevent complications, especially in pregnant women, as it can cause a premature delivery. CAUSES  Bacterial vaginosis is caused by an increase in harmful bacteria that are normally present in smaller amounts in the vagina. Several different kinds of bacteria can cause bacterial vaginosis. However, the reason that the condition develops is not fully understood. RISK FACTORS Certain activities or behaviors can put you at an increased risk of developing bacterial vaginosis, including:  Having a new sex partner or multiple sex partners.  Douching.  Using an intrauterine device (IUD) for contraception. Women do not get bacterial vaginosis from toilet seats, bedding, swimming pools, or contact with objects around them. SIGNS AND SYMPTOMS  Some women with bacterial vaginosis have no signs or symptoms. Common symptoms include:  Grey vaginal discharge.  A fishlike odor with discharge, especially after sexual intercourse.  Itching or burning of the vagina and vulva.  Burning or pain with urination. DIAGNOSIS  Your health care provider will take a medical history and examine the vagina for signs of bacterial vaginosis. A sample of vaginal fluid may be taken. Your health care provider will look at this sample under a microscope to check for bacteria and abnormal cells. A vaginal pH test may also be done.  TREATMENT  Bacterial vaginosis may be treated with antibiotic medicines. These may be given in the form of a pill or a vaginal cream. A second round of antibiotics may be prescribed if the condition comes back after treatment.  HOME CARE INSTRUCTIONS   Only take over-the-counter or prescription medicines as  directed by your health care provider.  If antibiotic medicine was prescribed, take it as directed. Make sure you finish it even if you start to feel better.  Do not have sex until treatment is completed.  Tell all sexual partners that you have a vaginal infection. They should see their health care provider and be treated if they have problems, such as a mild rash or itching.  Practice safe sex by using condoms and only having one sex partner. SEEK MEDICAL CARE IF:   Your symptoms are not improving after 3 days of treatment.  You have increased discharge or pain.  You have a fever. MAKE SURE YOU:   Understand these instructions.  Will watch your condition.  Will get help right away if you are not doing well or get worse. FOR MORE INFORMATION  Centers for Disease Control and Prevention, Division of STD Prevention: www.cdc.gov/std American Sexual Health Association (ASHA): www.ashastd.org  Document Released: 01/05/2005 Document Revised: 10/26/2012 Document Reviewed: 08/17/2012 ExitCare Patient Information 2015 [employer]. This information is not intended to replace advice given to you by your health care provider. Make sure you discuss any questions you have with your health care provider.  

## 2014-05-03 LAB — GC/CHLAMYDIA PROBE AMP (~~LOC~~) NOT AT ARMC
CHLAMYDIA, DNA PROBE: NEGATIVE
Neisseria Gonorrhea: NEGATIVE

## 2014-05-03 LAB — HIV ANTIBODY (ROUTINE TESTING W REFLEX): HIV SCREEN 4TH GENERATION: NONREACTIVE

## 2015-02-02 ENCOUNTER — Encounter (HOSPITAL_COMMUNITY): Payer: Self-pay | Admitting: *Deleted

## 2015-02-02 ENCOUNTER — Inpatient Hospital Stay (HOSPITAL_COMMUNITY)
Admission: AD | Admit: 2015-02-02 | Discharge: 2015-02-02 | Disposition: A | Discharge status: Home / Self Care | Payer: BLUE CROSS/BLUE SHIELD | Source: Ambulatory Visit | Attending: Obstetrics & Gynecology | Admitting: Obstetrics & Gynecology

## 2015-02-02 DIAGNOSIS — B373 Candidiasis of vulva and vagina: Secondary | ICD-10-CM | POA: Diagnosis not present

## 2015-02-02 DIAGNOSIS — N898 Other specified noninflammatory disorders of vagina: Secondary | ICD-10-CM | POA: Diagnosis present

## 2015-02-02 DIAGNOSIS — B3731 Acute candidiasis of vulva and vagina: Secondary | ICD-10-CM

## 2015-02-02 HISTORY — DX: Headache: R51

## 2015-02-02 HISTORY — DX: Chlamydial infection, unspecified: A74.9

## 2015-02-02 HISTORY — DX: Headache, unspecified: R51.9

## 2015-02-02 HISTORY — DX: Unspecified infectious disease: B99.9

## 2015-02-02 LAB — URINALYSIS, ROUTINE W REFLEX MICROSCOPIC
Bilirubin Urine: NEGATIVE
Glucose, UA: NEGATIVE mg/dL
Hgb urine dipstick: NEGATIVE
Ketones, ur: NEGATIVE mg/dL
Nitrite: NEGATIVE
PH: 5.5 (ref 5.0–8.0)
PROTEIN: NEGATIVE mg/dL
Specific Gravity, Urine: >1.03 — ABNORMAL HIGH (ref 1.005–1.030)

## 2015-02-02 LAB — WET PREP, GENITAL
CLUE CELLS WET PREP: NONE SEEN
Sperm: NONE SEEN
Trich, Wet Prep: NONE SEEN

## 2015-02-02 LAB — URINE MICROSCOPIC-ADD ON

## 2015-02-02 LAB — POCT PREGNANCY, URINE: Preg Test, Ur: NEGATIVE

## 2015-02-02 MED ORDER — FLUCONAZOLE 150 MG PO TABS
150.0000 mg | ORAL_TABLET | Freq: Every day | ORAL | Status: DC
  Administered 2015-02-02: 150 mg via ORAL
  Filled 2015-02-02: qty 1

## 2015-02-02 MED ORDER — FLUCONAZOLE 150 MG PO TABS: 150.0000 mg | ORAL_TABLET | Freq: Once | ORAL | Status: AC

## 2015-02-02 NOTE — MAU Provider Note (Signed)
History     CSN: [id number]  Arrival date and time: 02/02/15 0846   First Provider Initiated Contact with Patient 02/02/15 [phone]      Chief Complaint  Patient presents with  . Vaginal Discharge   HPI   Ms.Debbie Frederick is a 25 y.o. female G1P0010 presenting to MAU with concerns of possible yeast infection. She was treated for chlamydia 2 weeks ago and feels that the antibiotics may have caused a yeast infection. She was treated and her partner was treated. They abstained from intercourse for 7 days.   +thick, white vaginal discharge. No odor. + vaginitis.    OB History    Gravida Para Term Preterm AB TAB SAB Ectopic Multiple Living   1    1 1     0      Past Medical History  Diagnosis Date  . Anemia   . Bronchitis   . Herpes genitalia   . Complication of anesthesia     "Hard to wake me up"  . Ovarian cyst   . Headache   . Infection     UTI  . Chlamydia     Past Surgical History  Procedure Laterality Date  . Induced abortion  2010  . Hernia repair  2013    Family History  Problem Relation Age of Onset  . Arthritis Mother   . Asthma Sister   . Asthma Brother   . Cancer Maternal Grandmother     lung    Social History  Substance Use Topics  . Smoking status: Never Smoker   . Smokeless tobacco: Never Used  . Alcohol Use: Yes     Comment: occas    Allergies: No Known Allergies  Prescriptions prior to admission  Medication Sig Dispense Refill Last Dose  . metroNIDAZOLE (FLAGYL) 500 MG tablet Take 1 tablet (500 mg total) by mouth 2 (two) times daily. (Patient not taking: Reported on 02/02/2015) 14 tablet 0   . norgestimate-ethinyl estradiol (ORTHO-CYCLEN,SPRINTEC,PREVIFEM) 0.25-35 MG-MCG tablet Take 1 tablet by mouth daily. (Patient not taking: Reported on 05/02/2014) 1 Package 11    Results for orders placed or performed during the hospital encounter of 02/02/15 (from the past 48 hour(s))  Urinalysis, Routine w reflex microscopic (not at [facility])      Status: Abnormal   Collection Time: 02/02/15  9:05 AM  Result Value Ref Range   Color, Urine YELLOW YELLOW   APPearance CLEAR CLEAR   Specific Gravity, Urine >1.030 (H) 1.005 - 1.030   pH 5.5 5.0 - 8.0   Glucose, UA NEGATIVE NEGATIVE mg/dL   Hgb urine dipstick NEGATIVE NEGATIVE   Bilirubin Urine NEGATIVE NEGATIVE   Ketones, ur NEGATIVE NEGATIVE mg/dL   Protein, ur NEGATIVE NEGATIVE mg/dL   Nitrite NEGATIVE NEGATIVE   Leukocytes, UA TRACE (A) NEGATIVE  Urine microscopic-add on     Status: Abnormal   Collection Time: 02/02/15  9:05 AM  Result Value Ref Range   Squamous Epithelial / LPF 0-5 (A) NONE SEEN   WBC, UA 0-5 0 - 5 WBC/hpf   RBC / HPF 0-5 0 - 5 RBC/hpf   Bacteria, UA FEW (A) NONE SEEN   Urine-Other YEAST PRESENT   Pregnancy, urine POC     Status: None   Collection Time: 02/02/15  9:21 AM  Result Value Ref Range   Preg Test, Ur NEGATIVE NEGATIVE    Comment:        THE SENSITIVITY OF THIS METHODOLOGY IS >24 mIU/mL   Wet prep, genital  Status: Abnormal   Collection Time: 02/02/15  9:40 AM  Result Value Ref Range   Yeast Wet Prep HPF POC PRESENT (A) NONE SEEN   Trich, Wet Prep NONE SEEN NONE SEEN   Clue Cells Wet Prep HPF POC NONE SEEN NONE SEEN   WBC, Wet Prep HPF POC FEW (A) NONE SEEN    Comment: MODERATE BACTERIA SEEN   Sperm NONE SEEN     Review of Systems  Constitutional: Negative for fever and chills.  Gastrointestinal: Negative for abdominal pain.  Genitourinary: Negative for dysuria, urgency and frequency.   Physical Exam   Blood pressure 112/68, pulse 86, temperature 98.1 F (36.7 C), temperature source Oral, resp. rate 16, height 5\' 6"  (1.676 m), weight 109 lb 9.6 oz (49.714 kg), last menstrual period 11/15/2014.  Physical Exam  Constitutional: She is oriented to person, place, and time. She appears well-developed and well-nourished. No distress.  HENT:  Head: Normocephalic.  Eyes: Pupils are equal, round, and reactive to light.  GI: Soft. She  exhibits no distension. There is no tenderness. There is no rebound and no guarding.  Genitourinary:  Wet prep collected without speculum. Large amount of clumpy, white discharge noted near introitus.   Neurological: She is alert and oriented to person, place, and time.  Skin: Skin is warm. She is not diaphoretic.  Psychiatric: Her behavior is normal.    MAU Course  Procedures None  MDM  Wet prep  Diflucan given in MAU 150 mg PO  Assessment and Plan   A:  1. Yeast vaginitis      P:  Discharge home in stable condition RX: Diflucan to take in 3 days If symptoms persists, purchase a 7 day yeast treatment over the counter Return to MAU for emergencies Follow up with PCP as needed.  Condoms always.   Duane LopeJennifer I Kayia Billinger, NP 02/02/2015 11:16 AM

## 2015-02-02 NOTE — MAU Note (Signed)
Mid Dec was treated for Chlamydia,  Was treated again on the 28th, was told to follow up again in 2 wks.  Thinks she might have a yeast infection

## 2015-02-02 NOTE — Discharge Instructions (Signed)
Vaginitis °Vaginitis is an inflammation of the vagina. It is most often caused by a change in the normal balance of the bacteria and yeast that live in the vagina. This change in balance causes an overgrowth of certain bacteria or yeast, which causes the inflammation. There are different types of vaginitis, but the most common types are: °· Bacterial vaginosis. °· Yeast infection (candidiasis). °· Trichomoniasis vaginitis. This is a sexually transmitted infection (STI). °· Viral vaginitis. °· Atrophic vaginitis. °· Allergic vaginitis. °CAUSES  °The cause depends on the type of vaginitis. Vaginitis can be caused by: °· Bacteria (bacterial vaginosis). °· Yeast (yeast infection). °· A parasite (trichomoniasis vaginitis) °· A virus (viral vaginitis). °· Low hormone levels (atrophic vaginitis). Low hormone levels can occur during pregnancy, breastfeeding, or after menopause. °· Irritants, such as bubble baths, scented tampons, and feminine sprays (allergic vaginitis). °Other factors can change the normal balance of the yeast and bacteria that live in the vagina. These include: °· Antibiotic medicines. °· Poor hygiene. °· Diaphragms, vaginal sponges, spermicides, birth control pills, and intrauterine devices (IUD). °· Sexual intercourse. °· Infection. °· Uncontrolled diabetes. °· A weakened immune system. °SYMPTOMS  °Symptoms can vary depending on the cause of the vaginitis. Common symptoms include: °· Abnormal vaginal discharge. °· The discharge is white, gray, or yellow with bacterial vaginosis. °· The discharge is thick, white, and cheesy with a yeast infection. °· The discharge is frothy and yellow or greenish with trichomoniasis. °· A bad vaginal odor. °· The odor is fishy with bacterial vaginosis. °· Vaginal itching, pain, or swelling. °· Painful intercourse. °· Pain or burning when urinating. °Sometimes, there are no symptoms. °TREATMENT  °Treatment will vary depending on the type of infection.  °· Bacterial  vaginosis and trichomoniasis are often treated with antibiotic creams or pills. °· Yeast infections are often treated with antifungal medicines, such as vaginal creams or suppositories. °· Viral vaginitis has no cure, but symptoms can be treated with medicines that relieve discomfort. Your sexual partner should be treated as well. °· Atrophic vaginitis may be treated with an estrogen cream, pill, suppository, or vaginal ring. If vaginal dryness occurs, lubricants and moisturizing creams may help. You may be told to avoid scented soaps, sprays, or douches. °· Allergic vaginitis treatment involves quitting the use of the product that is causing the problem. Vaginal creams can be used to treat the symptoms. °HOME CARE INSTRUCTIONS  °· Take all medicines as directed by your caregiver. °· Keep your genital area clean and dry. Avoid soap and only rinse the area with water. °· Avoid douching. It can remove the healthy bacteria in the vagina. °· Do not use tampons or have sexual intercourse until your vaginitis has been treated. Use sanitary pads while you have vaginitis. °· Wipe from front to back. This avoids the spread of bacteria from the rectum to the vagina. °· Let air reach your genital area. °¨ Wear cotton underwear to decrease moisture buildup. °¨ Avoid wearing underwear while you sleep until your vaginitis is gone. °¨ Avoid tight pants and underwear or nylons without a cotton panel. °¨ Take off wet clothing (especially bathing suits) as soon as possible. °· Use mild, non-scented products. Avoid using irritants, such as: °¨ Scented feminine sprays. °¨ Fabric softeners. °¨ Scented detergents. °¨ Scented tampons. °¨ Scented soaps or bubble baths. °· Practice safe sex and use condoms. Condoms may prevent the spread of trichomoniasis and viral vaginitis. °SEEK MEDICAL CARE IF:  °· You have abdominal pain. °· You   have a fever or persistent symptoms for more than 2-3 days. °· You have a fever and your symptoms suddenly  get worse. °  °This information is not intended to replace advice given to you by your health care provider. Make sure you discuss any questions you have with your health care provider. °  °Document Released: 11/02/2006 Document Revised: 05/22/2014 Document Reviewed: 06/18/2011 °Elsevier Interactive Patient Education ©2016 Elsevier Inc. °Probiotics °WHAT ARE PROBIOTICS? °Probiotics are the good bacteria and yeasts that live in your body and keep you and your digestive system healthy. Probiotics also help your body's defense (immune) system and protect your body against bad bacterial growth.  °Certain foods contain probiotics, such as yogurt. Probiotics can also be purchased as a supplement. As with any supplement or drug, it is important to discuss its use with your health care provider.  °WHAT AFFECTS THE BALANCE OF BACTERIA IN MY BODY? °The balance of bacteria in your body can be affected by:  °· Antibiotic medicines. Antibiotics are sometimes necessary to treat infection. Unfortunately, they may kill good or friendly bacteria in your body as well as the bad bacteria. This may lead to stomach problems like diarrhea, gas, and cramping. °· Disease. Some conditions are the result of an overgrowth of bad bacteria, yeasts, parasites, or fungi. These conditions include:   °¨ Infectious diarrhea. °¨ Stomach and respiratory infections. °¨ Skin infections. °¨ Irritable bowel syndrome (IBS). °¨ Inflammatory bowel diseases. °¨ Ulcer due to Helicobacter pylori (H. pylori) infection. °¨ Tooth decay and periodontal disease. °¨ Vaginal infections. °Stress and poor diet may also lower the good bacteria in your body.  °WHAT TYPE OF PROBIOTIC IS RIGHT FOR ME? °Probiotics are available over the counter at your local pharmacy, health food, or grocery store. They come in many different forms, combinations of strains, and dosing strengths. Some may need to be refrigerated. Always read the label for storage and usage  instructions. °Specific strains have been shown to be more effective for certain conditions. Ask your health care provider what option is best for you.  °WHY WOULD I NEED PROBIOTICS? °There are many reasons your health care provider might recommend a probiotic supplement, including:  °· Diarrhea. °· Constipation. °· IBS. °· Respiratory infections. °· Yeast infections. °· Acne, eczema, and other skin conditions. °· Frequent urinary tract infections (UTIs). °ARE THERE SIDE EFFECTS OF PROBIOTICS? °Some people experience mild side effects when taking probiotics. Side effects are usually temporary and may include:  °· Gas. °· Bloating. °· Cramping. °Rarely, serious side effects, such as infection or immune system changes, may occur. °WHAT ELSE DO I NEED TO KNOW ABOUT PROBIOTICS?  °· There are many different strains of probiotics. Certain strains may be more effective depending on your condition. Probiotics are available in varying doses. Ask your health care provider which probiotic you should use and how often.   °· If you are taking probiotics along with antibiotics, it is generally recommended to wait at least 2 hours between taking the antibiotic and taking the probiotic.   °FOR MORE INFORMATION:  °National Center for Complementary and Alternative Medicine http://nccam.nih.gov/ °  °This information is not intended to replace advice given to you by your health care provider. Make sure you discuss any questions you have with your health care provider. °  °Document Released: 08/02/2013 Document Reviewed: 08/02/2013 °Elsevier Interactive Patient Education ©2016 Elsevier Inc. ° °

## 2015-04-30 ENCOUNTER — Inpatient Hospital Stay (HOSPITAL_COMMUNITY)
Admission: AD | Admit: 2015-04-30 | Discharge: 2015-04-30 | Discharge status: Absent without leave | Payer: BLUE CROSS/BLUE SHIELD | Source: Ambulatory Visit | Attending: Family Medicine | Admitting: Family Medicine

## 2015-05-29 IMAGING — US US PELVIS COMPLETE
1 series · 14 of 25 positions shown · non-contrast
Comparison: None

CLINICAL DATA: Pelvic pain, vomiting

EXAM:
TRANSABDOMINAL AND TRANSVAGINAL ULTRASOUND OF PELVIS
TECHNIQUE: Both transabdominal and transvaginal ultrasound examinations of the
pelvis were performed. Transabdominal technique was performed for
global imaging of the pelvis including uterus, ovaries, adnexal
regions, and pelvic cul-de-sac. It was necessary to proceed with
endovaginal exam following the transabdominal exam to visualize the
endometrium and bilateral ovaries.

[Series 1: us pelvis complete · 0.16mm/px · 72 acquisitions, 14 frames shown]
[im 1/72]
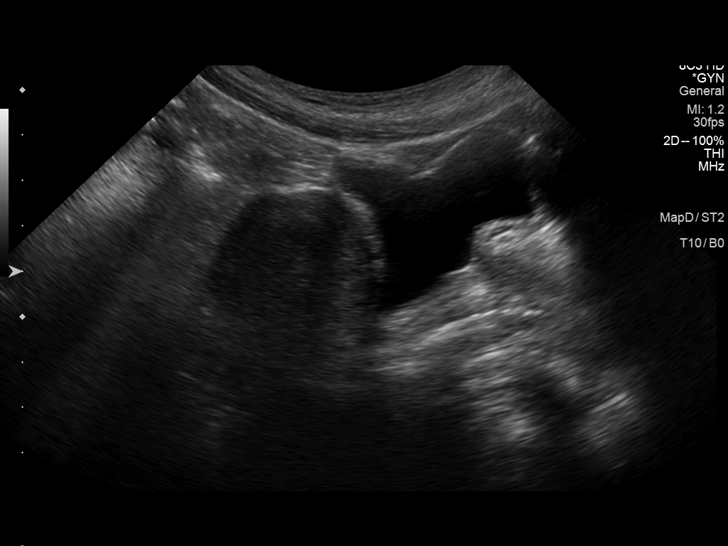
[im 6/72]
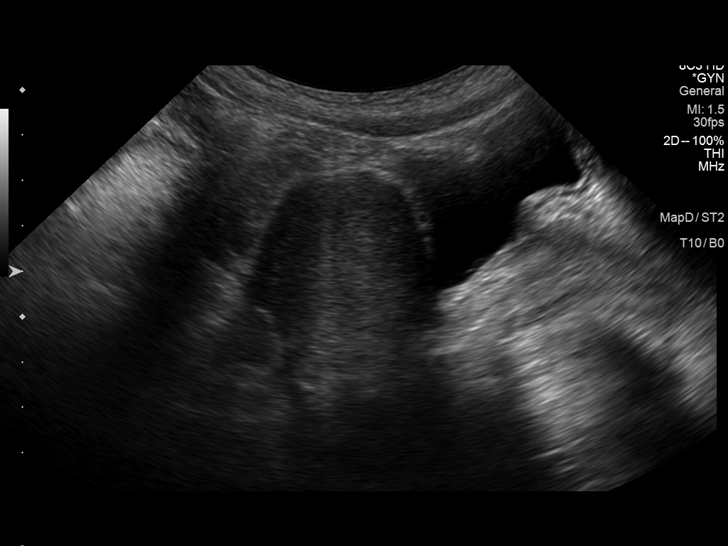
[im 12/72]
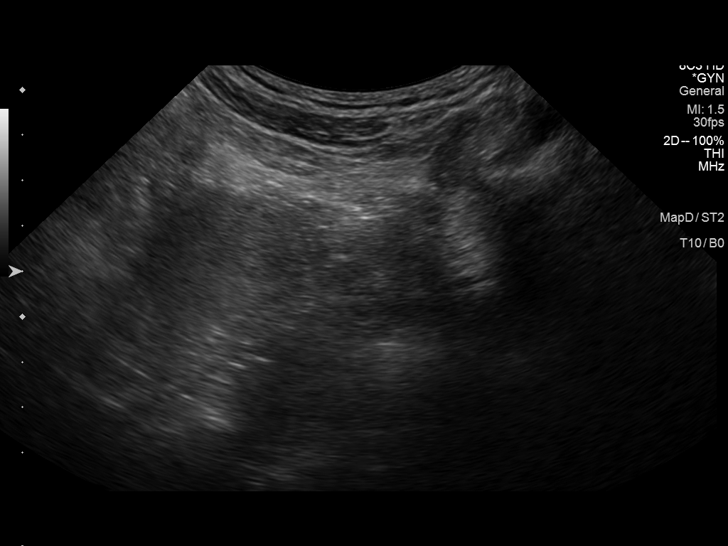
[im 18/72]
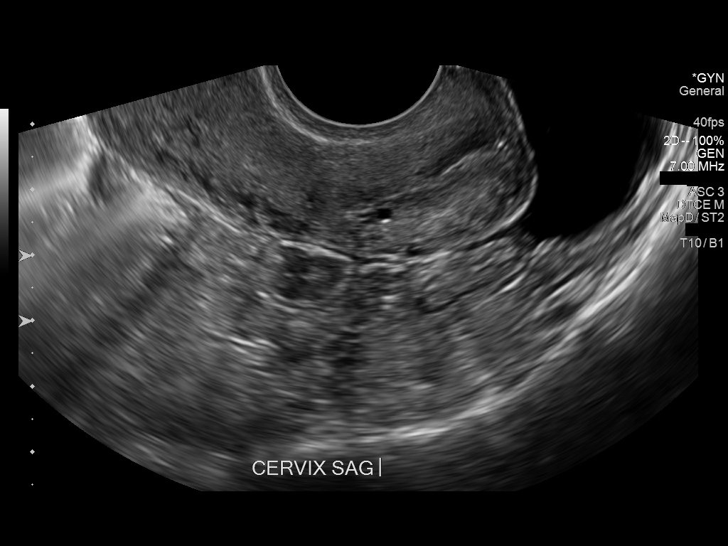
[im 24/72]
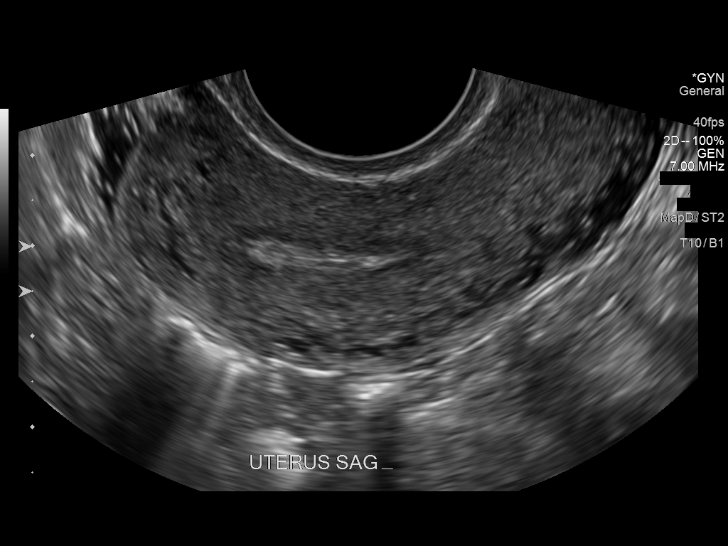
[im 27/72]
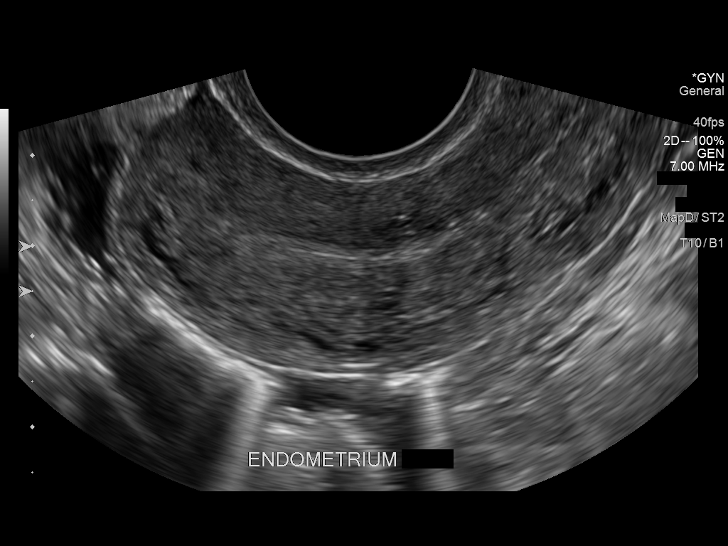
[im 33/72]
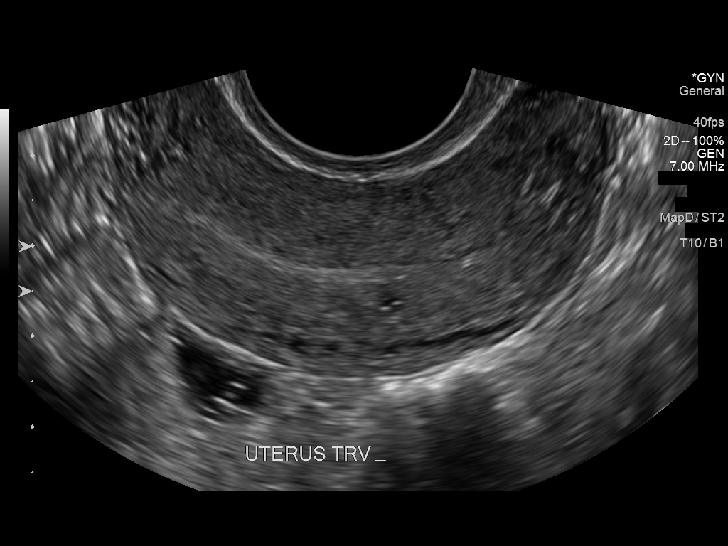
[im 39/72]
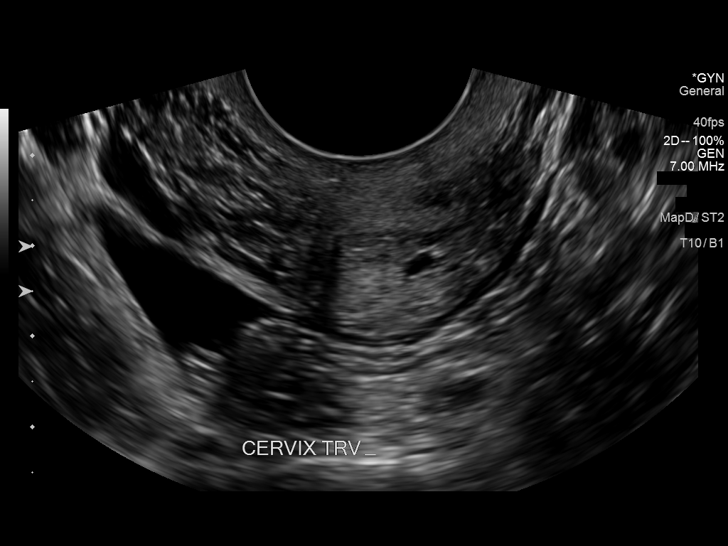
[im 45/72]
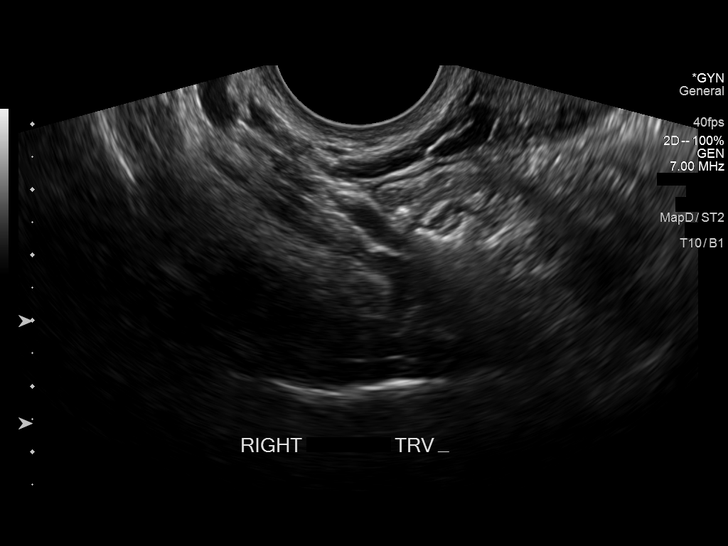
[im 48/72]
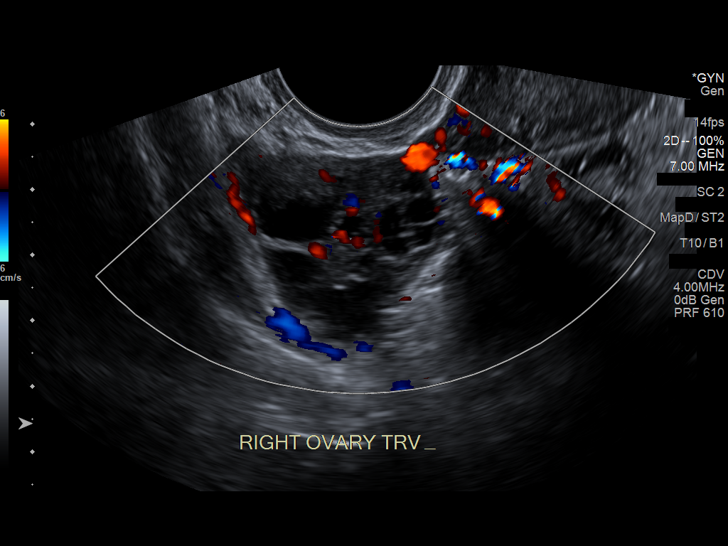
[im 54/72]
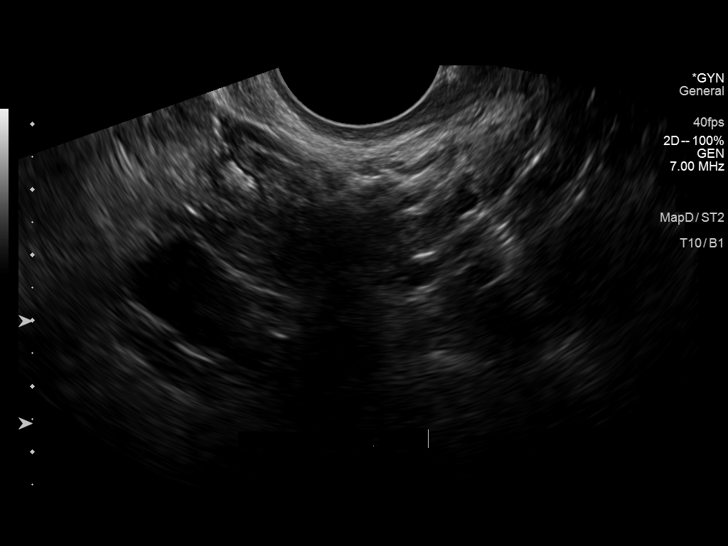
[im 60/72]
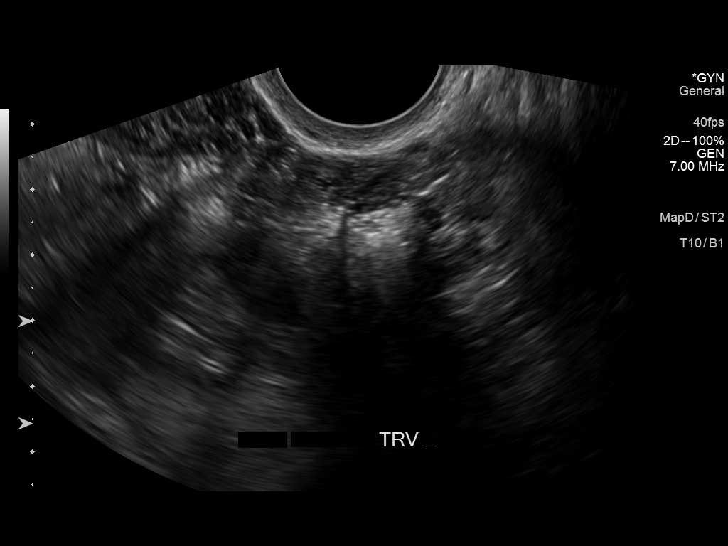
[im 66/72]
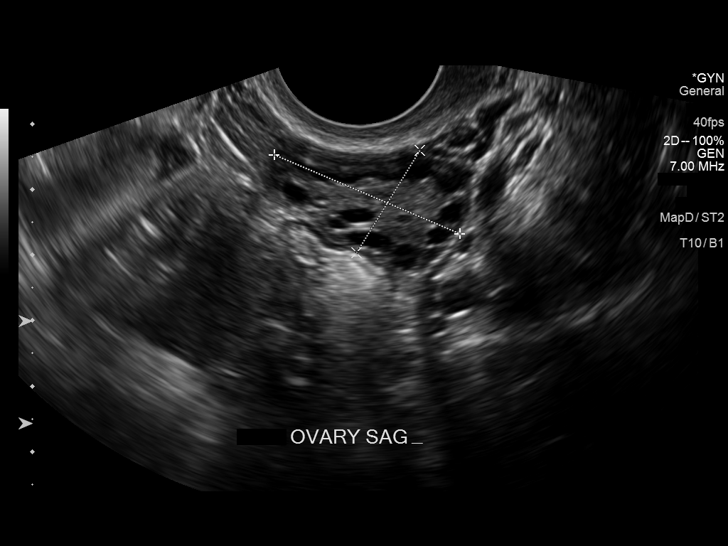
[im 72/72]
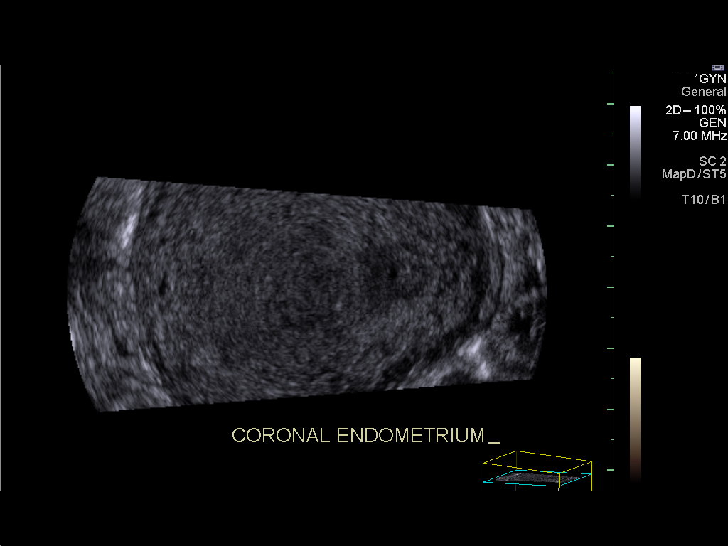

[14 of 25 positions shown; findings below may reference images not displayed]

FINDINGS: Uterus

Measurements: 6.6 x 2.2 x 5.2 cm. No fibroids or other mass
visualized.

Endometrium

Thickness: 3 mm.  No focal abnormality visualized.

Right ovary

Measurements: 3.3 x 2.5 x 2.9 cm. Normal appearance/no adnexal mass.

Left ovary

Measurements: 3.1 x 1.8 x 3.3 cm. Normal appearance/no adnexal mass.

Other findings

Moderate pelvic fluid/ascites, simple.
IMPRESSION: Negative pelvic ultrasound.

## 2016-07-29 ENCOUNTER — Emergency Department: Admit: 2016-07-29 | Payer: Self-pay

## 2016-07-29 ENCOUNTER — Inpatient Hospital Stay
Admit: 2016-07-29 | Discharge: 2016-07-29 | Disposition: A | Discharge status: Home / Self Care | Payer: Self-pay | Attending: Emergency Medicine

## 2016-07-29 DIAGNOSIS — N6320 Unspecified lump in the left breast, unspecified quadrant: Secondary | ICD-10-CM

## 2016-07-29 LAB — HCG URINE, QL: HCG urine, QL: NEGATIVE

## 2016-07-29 NOTE — ED Provider Notes (Signed)
EMERGENCY DEPARTMENT HISTORY AND PHYSICAL EXAM    12:15 PM      Date: 07/29/2016  Patient Name: Cindy Acevedo    History of Presenting Illness     Chief Complaint   Patient presents with   ??? Breast Mass         History Provided By: Patient    Chief Complaint: L breast mass  Duration:  Days  Timing:  Acute  Location: L breast  Quality: Aching  Severity: Mild  Modifying Factors: none  Associated Symptoms: denies any other associated signs or symptoms      Additional History (Context): Cindy Acevedo is a 26 y.o. female with No significant past medical history who presents with c/o L breast mass that she noticed yesterday during a self-breast examination. Pt notes she was having nipple pain so she performed the examination. Notes family hx of breast cancer, mother's mother and father's mother. Denies personal hx of breast cancer. Denies fever/chills, LAN, weight loss, breast discoloration. Has nipple piercing's and notes intermittent drainage from nipples chronically. LMP: May 16    PCP: None        Past History     Past Medical History:  No past medical history on file.    Past Surgical History:  No past surgical history on file.    Family History:  No family history on file.    Social History:  Social History   Substance Use Topics   ??? Smoking status: Not on file   ??? Smokeless tobacco: Not on file   ??? Alcohol use Not on file       Allergies:  No Known Allergies      Review of Systems       Review of Systems   Constitutional: Negative for chills and fever.   Respiratory: Negative for shortness of breath.    Cardiovascular: Negative for chest pain.   Gastrointestinal: Negative for abdominal pain, nausea and vomiting.   Skin: Negative for rash.   Neurological: Negative for weakness.   All other systems reviewed and are negative.        Physical Exam     Visit Vitals   ??? BP 117/68 (BP 1 Location: Right arm, BP Patient Position: At rest)   ??? Pulse (!) 54   ??? Temp 97.9 ??F (36.6 ??C)   ??? Resp 16   ??? SpO2 100%          Physical Exam   Constitutional: She appears well-developed and well-nourished. No distress.   HENT:   Head: Normocephalic and atraumatic.   Neck: Normal range of motion. Neck supple.   Cardiovascular: Normal rate, regular rhythm, normal heart sounds and intact distal pulses.  Exam reveals no gallop and no friction rub.    No murmur heard.  Pulmonary/Chest: Effort normal and breath sounds normal. No respiratory distress. She has no wheezes. She has no rales. Right breast exhibits no inverted nipple, no nipple discharge, no skin change and no tenderness. Left breast exhibits tenderness. Left breast exhibits no inverted nipple, no nipple discharge and no skin change. Breasts are symmetrical.       Nipple piercing b/l, exam chaperoned by nurse Dorene GrebeNatalie   Neurological: She is alert.   Skin: Skin is warm. No rash noted. She is not diaphoretic.   Nursing note and vitals reviewed.        Diagnostic Study Results     Labs -  Recent Results (from the past 12 hour(s))   HCG URINE, QL  Collection Time: 07/29/16 11:29 AM   Result Value Ref Range    HCG urine, QL NEGATIVE  NEG         Radiologic Studies -   US BREAST LT LIMITED=<3 QUAD   Final Result        IMPRESSION:  ??  Palpable left breast mass represents normal, dense ridges of breast tissue. No  suspicious finding or evidence of malignancy. Recommend additional follow-up as  clinically warranted.  ??  BI-RADS Assessment Category 1: negative / Letter 39N    Medical Decision Making   I am the first provider for this patient.    I reviewed the vital signs, available nursing notes, past medical history, past surgical history, family history and social history.    Vital Signs-Reviewed the patient's vital signs.      Records Reviewed: Nursing Notes (Time of Review: 12:15 PM)    ED Course: Progress Notes, Reevaluation, and Consults:  1:24 PM Reviewed results with patient. Discussed need for close outpatient follow-up. Stressed importance of this due to family hx of breast cancer.  Discussed strict return precautions, including fever, redness, swelling, or any other medical concerns.    Provider Notes (Medical Decision Making): 27 yo F who presents due to L breast mass she noticed on self breast examination. No LAN, no skin discoloration, no nipple retraction. No palpable mass on examination. 6'oclock region of L breast TTP. US demonstrates normal, dense breast tissue. Will provide referral to PMD for follow-up.      Diagnosis     Clinical Impression:   1. Breast pain        Disposition: home     Follow-up Information     Follow up With Details Comments Contact Info    [facility] EMERGENCY DEPT  If symptoms worsen [address]  [place] [address]  [phone]    [facility] Schedule an appointment as soon as possible for a visit  [address]  [place] [address]  [phone]           Patient's Medications    No medications on file

## 2016-07-29 NOTE — ED Notes (Signed)
I have reviewed discharge instructions with the patient.  The patient verbalized understanding.

## 2016-07-29 NOTE — ED Triage Notes (Signed)
Pt here for mass on left breast.
